# Patient Record
Sex: Female | Born: 1955
Health system: Southern US, Community
[De-identification: ages and names within clinical notes are randomized; demographics above are authoritative.]

## PROBLEM LIST (undated history)

## (undated) DIAGNOSIS — I10 Essential (primary) hypertension: Secondary | ICD-10-CM

## (undated) DIAGNOSIS — T7840XA Allergy, unspecified, initial encounter: Secondary | ICD-10-CM

## (undated) DIAGNOSIS — E119 Type 2 diabetes mellitus without complications: Secondary | ICD-10-CM

## (undated) DIAGNOSIS — I493 Ventricular premature depolarization: Secondary | ICD-10-CM

## (undated) DIAGNOSIS — I491 Atrial premature depolarization: Secondary | ICD-10-CM

## (undated) DIAGNOSIS — I499 Cardiac arrhythmia, unspecified: Secondary | ICD-10-CM

## (undated) HISTORY — DX: Cardiac arrhythmia, unspecified: I49.9

## (undated) HISTORY — DX: Atrial premature depolarization: I49.1

## (undated) HISTORY — PX: BREAST CYST EXCISION: SHX579

## (undated) HISTORY — DX: Ventricular premature depolarization: I49.3

## (undated) HISTORY — DX: Allergy, unspecified, initial encounter: T78.40XA

---

## 1999-03-21 ENCOUNTER — Other Ambulatory Visit: Admission: RE | Admit: 1999-03-21 | Discharge: 1999-03-21 | Payer: Self-pay | Admitting: Obstetrics and Gynecology

## 1999-04-17 ENCOUNTER — Emergency Department (HOSPITAL_COMMUNITY): Admission: EM | Admit: 1999-04-17 | Discharge: 1999-04-17 | Payer: Self-pay | Admitting: Emergency Medicine

## 1999-04-18 ENCOUNTER — Encounter: Payer: Self-pay | Admitting: *Deleted

## 2000-05-04 ENCOUNTER — Encounter: Admission: RE | Admit: 2000-05-04 | Discharge: 2000-05-04 | Payer: Self-pay | Admitting: Cardiology

## 2000-05-04 ENCOUNTER — Encounter: Payer: Self-pay | Admitting: Cardiology

## 2000-10-01 ENCOUNTER — Other Ambulatory Visit: Admission: RE | Admit: 2000-10-01 | Discharge: 2000-10-01 | Payer: Self-pay | Admitting: Obstetrics and Gynecology

## 2002-03-13 ENCOUNTER — Other Ambulatory Visit: Admission: RE | Admit: 2002-03-13 | Discharge: 2002-03-13 | Payer: Self-pay | Admitting: Obstetrics and Gynecology

## 2003-09-30 ENCOUNTER — Other Ambulatory Visit: Admission: RE | Admit: 2003-09-30 | Discharge: 2003-09-30 | Payer: Self-pay | Admitting: Obstetrics and Gynecology

## 2004-12-13 ENCOUNTER — Other Ambulatory Visit: Admission: RE | Admit: 2004-12-13 | Discharge: 2004-12-13 | Payer: Self-pay | Admitting: Obstetrics and Gynecology

## 2005-11-03 ENCOUNTER — Ambulatory Visit: Payer: Self-pay

## 2006-04-18 ENCOUNTER — Other Ambulatory Visit: Admission: RE | Admit: 2006-04-18 | Discharge: 2006-04-18 | Payer: Self-pay | Admitting: Obstetrics and Gynecology

## 2006-11-27 ENCOUNTER — Encounter: Admission: RE | Admit: 2006-11-27 | Discharge: 2006-11-27 | Payer: Self-pay | Admitting: Obstetrics and Gynecology

## 2007-03-27 ENCOUNTER — Ambulatory Visit: Payer: Self-pay | Admitting: Cardiology

## 2007-04-08 ENCOUNTER — Ambulatory Visit: Payer: Self-pay

## 2007-04-17 ENCOUNTER — Ambulatory Visit: Payer: Self-pay | Admitting: Cardiovascular Disease

## 2009-12-28 ENCOUNTER — Ambulatory Visit: Payer: Self-pay | Admitting: Gynecology

## 2009-12-28 ENCOUNTER — Other Ambulatory Visit: Admission: RE | Admit: 2009-12-28 | Discharge: 2009-12-28 | Payer: Self-pay | Admitting: Gynecology

## 2011-01-17 NOTE — Assessment & Plan Note (Signed)
[facility]                            CARDIOLOGY OFFICE NOTE   NAME:[name], Shannon HAUSNER                   MRN:          [id number]  DATE:03/27/2007                            DOB:          October 02, 1955    I got a call this morning from Dr.  Earl Lites to see Shannon [name]-  [name] (when we saw her in 2004, her name was Shannon [name]) for new  onset of atrial fibrillation.   HISTORY OF PRESENT ILLNESS:  We were asked by Dr.  Cleta Alberts to consult on  this very nice 55 -year-old [place] American female for palpitations and  just not feeling well since Sunday. She presented to Urgent Care this  morning and was found to be in atrial fibrillation with a rate of about  80 to 90 beats per minute.   She has been seen in the past and is a patient of Dr.  Theron Arista Nishan's.  She has had a history of PACs and PVCs that have been documented by  Holter monitoring. She had a 2D echo in 2004, which was normal. She also  had a stress Myoview which showed some anterior wall attenuation  probably secondary to breast attenuation. She has been treated  medically.   She is a very active walker and walks almost an hour every day. She has  no symptoms of angina or ischemic equivalence.   She started feeling bad this past Sunday. She has not had any syncope or  pre-syncope. She did have her blood pressure checked at a [facility] and it  was high in the 140s. She usually runs around 120/80.   She has no risk factors for thromboembolic stroke. Hence, a very low  [place] 2 score. Specifically, she has no history of diabetes,  hypertension, previous stroke, history of congestive heart failure and  she is young.   PAST MEDICAL HISTORY:   ALLERGIES:  SHE IS INTOLERANT OF CODEINE.   MEDICATIONS:  Multivitamin and fish oil. She lists something called Vasa  which is for urinary problems.   She does not smoke. She smoked in the past. She does not drinks or use  alcoholic beverages. She  does not use any caffeine.   She has been fasting from 5 a.m. to 5 p.m. for her church. She wonders  if this had anything to do with the atrial fibrillation. I suspect not.   PAST SURGICAL HISTORY:  She has had two C-sections in 1971 and 1977.   FAMILY HISTORY:  Is positive for premature coronary disease with her  mother dying of heart disease at age 11 and her father at age 74. Her  siblings are in good shape.   REVIEW OF SYSTEMS:  Other than the HPI is negative.   PHYSICAL EXAMINATION:  She is an extraordinarily pleasant. She is 5  feet, 3 inches. She weighs 194. Her blood pressure was 130/86. Pulse 80  to 90 and irregular.  HEENT: Normocephalic, atraumatic. PERRLA. Extra-ocular movements intact.  Sclerae are clear. Facial symmetry is normal. Dentition is satisfactory.  NECK: Supple. Carotid upstrokes are equal bilaterally without bruits.  There  is no JVD.  Thyroid is not enlarged. Trachea is midline.  LUNGS:  Are clear.  HEART: Reveals a nondisplaced PMI. She has a variable S1, S2.  ABDOMEN: Soft with good bowel sounds. There is no midline bruit.  EXTREMITIES: Reveals no cyanosis, clubbing or edema. Pulses are intact.  NEURO: Intact.   EKG was reviewed from Urgent Care and also I showed it to Dr.  Jens Som.  It is clearly atrial fibrillation with a well-controlled rate.   I walked her around the hallway and her heart rate jumped to about 120  and she became slightly winded.   ASSESSMENT:  1. New onset idiopathic or lone atrial fibrillation.  2. History of a normal 2D echo and Cardiolite in the past.  3. No symptoms of angina or ischemia.  4. No symptoms of thyroid disease or history of thyroid disease.   PLAN:  1. Begin Diltiazem extended release 240 mg daily.  2. A 2D echocardiogram to rule out any structural heart disease.  3. Enteric-coated aspirin 325 mg daily.  4. Followup with Charlton Haws, MD for further assessment and possibly      getting her back in normal  rhythm if she does not convert.     Thomas C. Daleen Squibb, MD, [facility]  Electronically Signed    TCW/MedQ  DD: 03/27/2007  DT: 03/27/2007  Job #: [id number]   cc:   Brett Canales A. Cleta Alberts, M.D.

## 2011-01-17 NOTE — Assessment & Plan Note (Signed)
[facility]                            CARDIOLOGY OFFICE NOTE   NAME:[name] [name] GARANT                   MRN:          [id number]  DATE:04/17/2007                            DOB:          07-07-1956    [name] [name] is a patient I saw back in 2004.  She was just seen by Dr.  Daleen [name] at the end of July.  She was referred by [name] [name] in Urgent Care  for atrial fibrillation.  The patient has had a history of PACs and  PVCs.  I did review her electrocardiogram and she appeared to have slow  lone atrial fibrillation.  She had a follow-up 2-D echocardiogram on  April 08, 2007, which was totally normal with an EF of 55%.  Dr. Daleen [name]  had told her to start taking Cardizem CD 120 a day.  The patient never  did this.  She has been taking an aspirin.   The patient's [place] score is 0, giving her annual risk of stroke of only  1.9%.   In talking to the patient she has really not noted that many  palpitations.  She is not fatigued.  There has been no TIA or CVA.  She  is nonhypertensive, nondiabetic, with no history of congestive heart  failure and her age is clearly less than 75.   REVIEW OF SYSTEMS:  Remarkable for going through menopause.  She has had  trouble sleeping.  She has taken occasional Ambien.  She otherwise has a  negative review of systems.   She was a little concerned about taking the Cardizem as she works as a  Location manager at [employer].  I explained to her that just about all  the drugs we use in medicine have a very long adverse reaction list, but  that Cardizem would be safe if she needed it in the future.   PHYSICAL EXAMINATION:  Blood pressure 120/70, pulse is 68 and regular.  She is afebrile.  Respiratory rate is 12.  She is a healthy-appearing, black, middle-aged female in no distress.  Affect is appropriate.  HEENT:  Normal.  NECK:  Supple.  There is no thyromegaly, no lymphadenopathy, no JVP  elevation, no goiter.  LUNGS:  Clear.   Good diaphragmatic motion.  No wheezing.  CARDIOVASCULAR:  Normal heart sounds.  PMI is normal.  ABDOMEN:  Benign.  Bowel sounds positive.  No AAA.  No tenderness.  __________  reflux.  EXTREMITIES:  Distal pulses are intact with no edema.  Femorals are +4  bilaterally.  There is no muscular weakness.  SKIN:  Warm and dry.  NEUROLOGIC:  Nonfocal.   EKG today in our office was totally normal.  Heart rate 68, PR interval  140, QRS 80, QT interval 390.   IMPRESSION:  1. Isolated episode of what appears to be lone atrial fibrillation.  I      am not sure if it was vagally mediated but I was impressed that her      heart rate was not very high for a person her age.  For the time  being I told the patient I would not place her on Cardizem.  She is      in no need of Coumadin, given a [place] score of 0.  She will continue      to take a baby aspirin a day.  I will see her back in three months.      She knows to call me if she were to have any rapid palpitations or      TIA-like symptoms.  2. The patient needs health care maintenance.  She usually goes to      Urgent Care.  I will take the liberty of referring her to [name] [name] from [facility], as I think their personalities would      be a good match.  The patient has good health insurance from [facility] through [place] and I do not think this will be an      issue in terms of establishing a long term primary care doctor.  3. In the setting of atrial fibrillation I noticed that the patient      had a CBC at Urgent Care which was fine, but I think she needs a      follow-up TSH and T4.  As long as we can get her in to see Dr.      Waynard [name] in a timely fashion, I will let him do all of the blood work      including her cholesterol check and the rest of her health care      maintenance.     Shannon Pick. Eden Emms, MD, [facility]  Electronically Signed    PCN/MedQ  DD: 04/17/2007  DT: 04/18/2007  Job #: [id number]

## 2011-01-26 ENCOUNTER — Encounter: Payer: Self-pay | Admitting: Cardiovascular Disease

## 2011-01-27 ENCOUNTER — Ambulatory Visit (INDEPENDENT_AMBULATORY_CARE_PROVIDER_SITE_OTHER): Payer: 59 | Admitting: Cardiovascular Disease

## 2011-01-27 ENCOUNTER — Encounter: Payer: Self-pay | Admitting: Cardiovascular Disease

## 2011-01-27 DIAGNOSIS — I480 Paroxysmal atrial fibrillation: Secondary | ICD-10-CM

## 2011-01-27 DIAGNOSIS — I4891 Unspecified atrial fibrillation: Secondary | ICD-10-CM

## 2011-01-27 DIAGNOSIS — R0600 Dyspnea, unspecified: Secondary | ICD-10-CM | POA: Insufficient documentation

## 2011-01-27 NOTE — Assessment & Plan Note (Signed)
Functional NOrmal ECG and exam.  Malaise and fatigue not cardiac related

## 2011-01-27 NOTE — Assessment & Plan Note (Signed)
No evidence of recurrence Continue ASA

## 2011-01-27 NOTE — Progress Notes (Signed)
55 yo referred by Dr Cleta Alberts for fatigue SOB and history of PAF.  Reviewed previous records.  Last seen in 2008  Short episode of isolated lone afib.  Normal echo and myovue.  Only took cardizem for short time. On ASA.  No recurrence.  Has general malaise and mild dyspnea on exertion.  Lab work at urgent care ok.  No overt signs of volume overload or CHF.  Occasional palpitations but nothing prolonged.  Weight up a bit and diet poor.  No syncope, SSCP, PND or orthopnea.  Told patient that her malaise fatigue and mild exertional dyspnea were not cardiac related.  No evidence of recurrent afib and ECG today normal  ROS: Denies fever, malais, weight loss, blurry vision, decreased visual acuity, cough, sputum, SOB, hemoptysis, pleuritic pain, palpitaitons, heartburn, abdominal pain, melena, lower extremity edema, claudication, or rash.   General: Affect appropriate Healthy:  appears stated age HEENT: normal Neck supple with no adenopathy JVP normal no bruits no thyromegaly Lungs clear with no wheezing and good diaphragmatic motion Heart:  S1/S2 no murmur,rub, gallop or click PMI normal Abdomen: benighn, BS positve, no tenderness, no AAA no bruit.  No HSM or HJR Distal pulses intact with no bruits No edema Neuro non-focal Skin warm and dry No muscular weakness  Medications Current Outpatient Prescriptions  Medication Sig Dispense Refill  . aspirin 325 MG tablet Take 325 mg by mouth daily.        . fish oil-omega-3 fatty acids 1000 MG capsule Take 2 g by mouth daily.        . Multiple Vitamin (MULTIVITAMIN) capsule Take 1 capsule by mouth daily.        Marland Kitchen DISCONTD: diltiazem (CARDIZEM CD) 240 MG 24 hr capsule Take 240 mg by mouth daily.          Allergies Codeine  Family History: Family History  Problem Relation Age of Onset  . Heart disease Mother     age 24  . Heart disease Father     age 71    Social History: History   Social History  . Marital Status: Married    Spouse Name:  N/A    Number of Children: N/A  . Years of Education: N/A   Occupational History  . Not on file.   Social History Main Topics  . Smoking status: Former Smoker    Quit date: 09/04/1988  . Smokeless tobacco: Not on file  . Alcohol Use: No  . Drug Use: Not on file  . Sexually Active: Not on file   Other Topics Concern  . Not on file   Social History Narrative  . No narrative on file    Electrocardiogram:  NSR 83 normal ECG  Assessment and Plan

## 2011-07-19 ENCOUNTER — Other Ambulatory Visit: Payer: Self-pay | Admitting: Obstetrics and Gynecology

## 2011-07-19 ENCOUNTER — Other Ambulatory Visit (HOSPITAL_COMMUNITY)
Admission: RE | Admit: 2011-07-19 | Discharge: 2011-07-19 | Disposition: A | Discharge status: Home / Self Care | Payer: 59 | Source: Ambulatory Visit | Attending: Obstetrics and Gynecology | Admitting: Obstetrics and Gynecology

## 2011-07-19 DIAGNOSIS — Z01419 Encounter for gynecological examination (general) (routine) without abnormal findings: Secondary | ICD-10-CM | POA: Insufficient documentation

## 2012-02-13 ENCOUNTER — Encounter: Payer: Self-pay | Admitting: Cardiovascular Disease

## 2012-02-13 ENCOUNTER — Telehealth: Payer: Self-pay | Admitting: Cardiovascular Disease

## 2012-02-13 NOTE — Telephone Encounter (Signed)
New Problem:    I called the home number [phone]) listed for the patient and her emergency contact and received a message that the number had been disconnected or was no longer in service.  I called the work number [phone]) listed for the patient and received a message that had been disconnected.  I sent a letter to patient's listed home address.

## 2013-03-12 ENCOUNTER — Ambulatory Visit (INDEPENDENT_AMBULATORY_CARE_PROVIDER_SITE_OTHER): Payer: 59 | Admitting: Physician Assistant

## 2013-03-12 VITALS — BP 122/84 | HR 82 | Temp 98.3°F | Resp 18 | Ht 64.0 in | Wt 208.0 lb

## 2013-03-12 DIAGNOSIS — J069 Acute upper respiratory infection, unspecified: Secondary | ICD-10-CM

## 2013-03-12 DIAGNOSIS — J029 Acute pharyngitis, unspecified: Secondary | ICD-10-CM

## 2013-03-12 LAB — POCT RAPID STREP A (OFFICE): Rapid Strep A Screen: NEGATIVE

## 2013-03-12 MED ORDER — FIRST-DUKES MOUTHWASH MT SUSP: 10.0000 mL | OROMUCOSAL | Status: DC | PRN

## 2013-03-12 MED ORDER — BENZONATATE 100 MG PO CAPS: 100.0000 mg | ORAL_CAPSULE | Freq: Three times a day (TID) | ORAL | Status: DC | PRN

## 2013-03-12 MED ORDER — IPRATROPIUM BROMIDE 0.03 % NA SOLN: 2.0000 | Freq: Two times a day (BID) | NASAL | Status: DC

## 2013-03-12 NOTE — Patient Instructions (Addendum)
Your rapid strep test is negative today.  I suspect that your symptoms are due to a virus.  I would encourage you to continue taking Zyrtec daily - this will help with nasal symptoms.  Use the Atrovent nasal spray 2-3 times per day to help with congestion and post-nasal drainage.  Magic Mouthwash every 2 hours as needed for sore throat.  I would also recommend 400-600mg  ibuprofen every 8 hours with food to help with throat pain/inflammation.  Tessalon Perles for cough if needed.  Plenty of fluids and rest.  Please let me know if any symptoms are changing or worsening - i.e. Fever, worsening throat pain, worsening cough, shortness of breath.  I am sending out a throat culture as well just to confirm that this is not strep.  I will let you know when the results are back and if we need to do anything differently.   Upper Respiratory Infection, Adult An upper respiratory infection (URI) is also sometimes known as the common cold. The upper respiratory tract includes the nose, sinuses, throat, trachea, and bronchi. Bronchi are the airways leading to the lungs. Most people improve within 1 week, but symptoms can last up to 2 weeks. A residual cough may last even longer.  CAUSES Many different viruses can infect the tissues lining the upper respiratory tract. The tissues become irritated and inflamed and often become very moist. Mucus production is also common. A cold is contagious. You can easily spread the virus to others by oral contact. This includes kissing, sharing a glass, coughing, or sneezing. Touching your mouth or nose and then touching a surface, which is then touched by another person, can also spread the virus. SYMPTOMS  Symptoms typically develop 1 to 3 days after you come in contact with a cold virus. Symptoms vary from person to person. They may include:  Runny nose.  Sneezing.  Nasal congestion.  Sinus irritation.  Sore throat.  Loss of voice  (laryngitis).  Cough.  Fatigue.  Muscle aches.  Loss of appetite.  Headache.  Low-grade fever. DIAGNOSIS  You might diagnose your own cold based on familiar symptoms, since most people get a cold 2 to 3 times a year. Your caregiver can confirm this based on your exam. Most importantly, your caregiver can check that your symptoms are not due to another disease such as strep throat, sinusitis, pneumonia, asthma, or epiglottitis. Blood tests, throat tests, and X-rays are not necessary to diagnose a common cold, but they may sometimes be helpful in excluding other more serious diseases. Your caregiver will decide if any further tests are required. RISKS AND COMPLICATIONS  You may be at risk for a more severe case of the common cold if you smoke cigarettes, have chronic heart disease (such as heart failure) or lung disease (such as asthma), or if you have a weakened immune system. The very young and very old are also at risk for more serious infections. Bacterial sinusitis, middle ear infections, and bacterial pneumonia can complicate the common cold. The common cold can worsen asthma and chronic obstructive pulmonary disease (COPD). Sometimes, these complications can require emergency medical care and may be life-threatening. PREVENTION  The best way to protect against getting a cold is to practice good hygiene. Avoid oral or hand contact with people with cold symptoms. Wash your hands often if contact occurs. There is no clear evidence that vitamin C, vitamin E, echinacea, or exercise reduces the chance of developing a cold. However, it is always recommended to get  plenty of rest and practice good nutrition. TREATMENT  Treatment is directed at relieving symptoms. There is no cure. Antibiotics are not effective, because the infection is caused by a virus, not by bacteria. Treatment may include:  Increased fluid intake. Sports drinks offer valuable electrolytes, sugars, and fluids.  Breathing  heated mist or steam (vaporizer or shower).  Eating chicken soup or other clear broths, and maintaining good nutrition.  Getting plenty of rest.  Using gargles or lozenges for comfort.  Controlling fevers with ibuprofen or acetaminophen as directed by your caregiver.  Increasing usage of your inhaler if you have asthma. Zinc gel and zinc lozenges, taken in the first 24 hours of the common cold, can shorten the duration and lessen the severity of symptoms. Pain medicines may help with fever, muscle aches, and throat pain. A variety of non-prescription medicines are available to treat congestion and runny nose. Your caregiver can make recommendations and may suggest nasal or lung inhalers for other symptoms.  HOME CARE INSTRUCTIONS   Only take over-the-counter or prescription medicines for pain, discomfort, or fever as directed by your caregiver.  Use a warm mist humidifier or inhale steam from a shower to increase air moisture. This may keep secretions moist and make it easier to breathe.  Drink enough water and fluids to keep your urine clear or pale yellow.  Rest as needed.  Return to work when your temperature has returned to normal or as your caregiver advises. You may need to stay home longer to avoid infecting others. You can also use a face mask and careful hand washing to prevent spread of the virus. SEEK MEDICAL CARE IF:   After the first few days, you feel you are getting worse rather than better.  You need your caregiver's advice about medicines to control symptoms.  You develop chills, worsening shortness of breath, or brown or red sputum. These may be signs of pneumonia.  You develop yellow or brown nasal discharge or pain in the face, especially when you bend forward. These may be signs of sinusitis.  You develop a fever, swollen neck glands, pain with swallowing, or white areas in the back of your throat. These may be signs of strep throat. SEEK IMMEDIATE MEDICAL CARE  IF:   You have a fever.  You develop severe or persistent headache, ear pain, sinus pain, or chest pain.  You develop wheezing, a prolonged cough, cough up blood, or have a change in your usual mucus (if you have chronic lung disease).  You develop sore muscles or a stiff neck. Document Released: 02/14/2001 Document Revised: 11/13/2011 Document Reviewed: 12/23/2010 [facility] Patient Information 2014 [place], [place].

## 2013-03-12 NOTE — Progress Notes (Signed)
  Subjective:    Patient ID: Shannon [name], female    DOB: 08-30-1956, 57 y.o.   MRN: [id number]  HPI   [name] is a very pleasant 57 yr old female here with concern for illness.  States that for 3-4 days she has had a sore throat and cough.  Coughing up green stuff.  Some wheezing but no SOB.  The throat was very sore yesterday - "couldn't hardly swallow."  Does endorse some PND but also has nasal drainage as well.  No GI sympoms or HA.  Some itchiness of her ears.  No fever.  Has tried Tylenol and warm liquids.  Was concerned about taking too much Tylenol, though it did help the throat pain.  Pt takes daily Zyrtec for allergies.  No known strep contacts.  Does think a lady at church was sick this past Sunday.     Review of Systems  Constitutional: Negative for chills.  HENT: Positive for congestion, sore throat, rhinorrhea and postnasal drip.   Respiratory: Positive for cough and wheezing. Negative for shortness of breath.   Cardiovascular: Negative.   Gastrointestinal: Negative.   Musculoskeletal: Negative.   Skin: Negative.   Neurological: Negative.        Objective:   Physical Exam  Vitals reviewed. Constitutional: She is oriented to person, place, and time. She appears well-developed and well-nourished. No distress.  HENT:  Head: Normocephalic and atraumatic.  Right Ear: Tympanic membrane and ear canal normal.  Left Ear: Tympanic membrane and ear canal normal.  Nose: Mucosal edema and rhinorrhea present.  Mouth/Throat: Uvula is midline and mucous membranes are normal. Posterior oropharyngeal edema and posterior oropharyngeal erythema present. No oropharyngeal exudate or tonsillar abscesses.  Neck: Neck supple.  Cardiovascular: Normal rate, regular rhythm and normal heart sounds.   Pulmonary/Chest: Effort normal and breath sounds normal. She has no wheezes. She has no rales.  Abdominal: Soft. There is no tenderness.  Lymphadenopathy:    She has no cervical adenopathy.   Neurological: She is alert and oriented to person, place, and time.  Skin: Skin is warm and dry.  Psychiatric: She has a normal mood and affect. Her behavior is normal.     Results for orders placed in visit on 03/12/13  POCT RAPID STREP A (OFFICE)      Result Value Range   Rapid Strep A Screen Negative  Negative        Assessment & Plan:  Viral URI - Plan: ipratropium (ATROVENT) 0.03 % nasal spray, benzonatate (TESSALON) 100 MG capsule  Acute pharyngitis - Plan: POCT rapid strep A, Culture, Group A Strep, Diphenhyd-Hydrocort-Nystatin (FIRST-DUKES MOUTHWASH) SUSP   [name] is a very pleasant 57 yr old female with URI and pharyngitis.  Suspect viral etiology.  She is afebrile and well appearing today.  Rapid strep is negative.  Cx pending.  Will treat symptoms with Atrovent, Duke's, Tessalon, ibuprofen. Continue daily Zyrtec.  Push fluids, plenty of rest.  Discussed RTC precautions with pt, specifically fever, worsening throat pain, worsening cough, SOB, etc.  Pt understands and is in agreement with this plan.

## 2013-03-14 LAB — CULTURE, GROUP A STREP: Organism ID, Bacteria: NORMAL

## 2013-12-18 ENCOUNTER — Ambulatory Visit (INDEPENDENT_AMBULATORY_CARE_PROVIDER_SITE_OTHER): Payer: 59 | Admitting: Emergency Medicine

## 2013-12-18 VITALS — BP 128/80 | HR 107 | Temp 101.0°F | Resp 17 | Ht 64.0 in | Wt 194.0 lb

## 2013-12-18 DIAGNOSIS — J209 Acute bronchitis, unspecified: Secondary | ICD-10-CM

## 2013-12-18 DIAGNOSIS — J018 Other acute sinusitis: Secondary | ICD-10-CM

## 2013-12-18 MED ORDER — IPRATROPIUM BROMIDE 0.02 % IN SOLN
0.5000 mg | Freq: Once | RESPIRATORY_TRACT | Status: AC
  Administered 2013-12-18: 0.5 mg via RESPIRATORY_TRACT

## 2013-12-18 MED ORDER — AMOXICILLIN-POT CLAVULANATE 875-125 MG PO TABS: 1.0000 | ORAL_TABLET | Freq: Two times a day (BID) | ORAL | Status: DC

## 2013-12-18 MED ORDER — ALBUTEROL SULFATE HFA 108 (90 BASE) MCG/ACT IN AERS: 2.0000 | INHALATION_SPRAY | RESPIRATORY_TRACT | Status: DC | PRN

## 2013-12-18 MED ORDER — PSEUDOEPHEDRINE-GUAIFENESIN ER 60-600 MG PO TB12: 1.0000 | ORAL_TABLET | Freq: Two times a day (BID) | ORAL | Status: DC

## 2013-12-18 MED ORDER — ALBUTEROL SULFATE (2.5 MG/3ML) 0.083% IN NEBU
5.0000 mg | INHALATION_SOLUTION | Freq: Once | RESPIRATORY_TRACT | Status: AC
  Administered 2013-12-18: 5 mg via RESPIRATORY_TRACT

## 2013-12-18 NOTE — Progress Notes (Signed)
[facility] [address] [place] [address] [phone]  Date:  12/18/2013   Name:  Shannon [name]   DOB:  07/08/1956   MRN:  [id number]  PCP:  Osborne Casco, MD    Chief Complaint: Cough and URI   History of Present Illness:  Shannon [name] is a 58 y.o. very pleasant female patient who presents with the following:  Ill with nasal congestion and purulent drainage since Tuesday.  Initially had trouble with "allergies" and soon became purulent.  Has a sore throat and cough productive of purulent sputum.  Some wheezing and no shortness of breath.  No nausea or vomiting.  No stool change.  No rash.  No improvement with over the counter medications or other home remedies. Denies other complaint or health concern today.   Patient Active Problem List   Diagnosis Date Noted  . PAF (paroxysmal atrial fibrillation) 01/27/2011  . Dyspnea 01/27/2011    Past Medical History  Diagnosis Date  . Arrhythmia     atrial fibrillation  . PAC (premature atrial contraction)   . PVC (premature ventricular contraction)   . Allergy     Past Surgical History  Procedure Laterality Date  . Cesarean section  1971 &1977    History  Substance Use Topics  . Smoking status: Former Smoker    Quit date: 09/04/1988  . Smokeless tobacco: Not on file  . Alcohol Use: No    Family History  Problem Relation Age of Onset  . Heart disease Mother     age 29  . Hypertension Mother   . Diabetes Mother   . Heart disease Father     age 44  . Hypertension Brother   . Hypertension Maternal Grandmother   . Diabetes Maternal Grandfather     Allergies  Allergen Reactions  . Codeine     Medication list has been reviewed and updated.  Current Outpatient Prescriptions on File Prior to Visit  Medication Sig Dispense Refill  . aspirin 325 MG tablet Take 325 mg by mouth daily.        . benzonatate (TESSALON) 100 MG capsule Take 1-2 capsules (100-200 mg total) by  mouth 3 (three) times daily as needed for cough.  40 capsule  0  . Diphenhyd-Hydrocort-Nystatin (FIRST-DUKES MOUTHWASH) SUSP Take 10 mLs by mouth every 2 (two) hours as needed. With 1:1 ratio viscous lidocaine  360 mL  0  . fish oil-omega-3 fatty acids 1000 MG capsule Take 2 g by mouth daily.        . hydrochlorothiazide (MICROZIDE) 12.5 MG capsule Take 12.5 mg by mouth daily.      Marland Kitchen ipratropium (ATROVENT) 0.03 % nasal spray Place 2 sprays into the nose 2 (two) times daily.  30 mL  1  . Multiple Vitamin (MULTIVITAMIN) capsule Take 1 capsule by mouth daily.         No current facility-administered medications on file prior to visit.    Review of Systems:  As per HPI, otherwise negative.    Physical Examination: Filed Vitals:   12/18/13 1556  BP: 128/80  Pulse: 107  Temp: 101 F (38.3 C)  Resp: 17   Filed Vitals:   12/18/13 1556  Height: 5\' 4"  (1.626 m)  Weight: 194 lb (87.998 kg)   Body mass index is 33.28 kg/(m^2). Ideal Body Weight: Weight in (lb) to have BMI = 25: 145.3  GEN: WDWN, NAD, Non-toxic, A & O x 3 HEENT: Atraumatic, Normocephalic. Neck  supple. No masses, No LAD. Ears and Nose: No external deformity. CV: RRR, No M/G/R. No JVD. No thrill. No extra heart sounds. PULM: CTA B, diffuse coarse wheezes, persistent cough, no crackles, rhonchi. No retractions. No resp. distress. No accessory muscle use. ABD: S, NT, ND, +BS. No rebound. No HSM. EXTR: No c/c/e NEURO Normal gait.  PSYCH: Normally interactive. Conversant. Not depressed or anxious appearing.  Calm demeanor.    Assessment and Plan: Bronchitis Sinusitis Neb treatment Albuterol MDI mucinex d Phen c cod augmentin  Signed,  Ellison Carwin, MD

## 2013-12-18 NOTE — Patient Instructions (Signed)
Sinusitis Sinusitis is redness, soreness, and swelling (inflammation) of the paranasal sinuses. Paranasal sinuses are air pockets within the bones of your face (beneath the eyes, the middle of the forehead, or above the eyes). In healthy paranasal sinuses, mucus is able to drain out, and air is able to circulate through them by way of your nose. However, when your paranasal sinuses are inflamed, mucus and air can become trapped. This can allow bacteria and other germs to grow and cause infection. Sinusitis can develop quickly and last only a short time (acute) or continue over a long period (chronic). Sinusitis that lasts for more than 12 weeks is considered chronic.  CAUSES  Causes of sinusitis include:  Allergies.  Structural abnormalities, such as displacement of the cartilage that separates your nostrils (deviated septum), which can decrease the air flow through your nose and sinuses and affect sinus drainage.  Functional abnormalities, such as when the small hairs (cilia) that line your sinuses and help remove mucus do not work properly or are not present. SYMPTOMS  Symptoms of acute and chronic sinusitis are the same. The primary symptoms are pain and pressure around the affected sinuses. Other symptoms include:  Upper toothache.  Earache.  Headache.  Bad breath.  Decreased sense of smell and taste.  A cough, which worsens when you are lying flat.  Fatigue.  Fever.  Thick drainage from your nose, which often is green and may contain pus (purulent).  Swelling and warmth over the affected sinuses. DIAGNOSIS  Your caregiver will perform a physical exam. During the exam, your caregiver may:  Look in your nose for signs of abnormal growths in your nostrils (nasal polyps).  Tap over the affected sinus to check for signs of infection.  View the inside of your sinuses (endoscopy) with a special imaging device with a light attached (endoscope), which is inserted into your  sinuses. If your caregiver suspects that you have chronic sinusitis, one or more of the following tests may be recommended:  Allergy tests.  Nasal culture A sample of mucus is taken from your nose and sent to a lab and screened for bacteria.  Nasal cytology A sample of mucus is taken from your nose and examined by your caregiver to determine if your sinusitis is related to an allergy. TREATMENT  Most cases of acute sinusitis are related to a viral infection and will resolve on their own within 10 days. Sometimes medicines are prescribed to help relieve symptoms (pain medicine, decongestants, nasal steroid sprays, or saline sprays).  However, for sinusitis related to a bacterial infection, your caregiver will prescribe antibiotic medicines. These are medicines that will help kill the bacteria causing the infection.  Rarely, sinusitis is caused by a fungal infection. In theses cases, your caregiver will prescribe antifungal medicine. For some cases of chronic sinusitis, surgery is needed. Generally, these are cases in which sinusitis recurs more than 3 times per year, despite other treatments. HOME CARE INSTRUCTIONS   Drink plenty of water. Water helps thin the mucus so your sinuses can drain more easily.  Use a humidifier.  Inhale steam 3 to 4 times a day (for example, sit in the bathroom with the shower running).  Apply a warm, moist washcloth to your face 3 to 4 times a day, or as directed by your caregiver.  Use saline nasal sprays to help moisten and clean your sinuses.  Take over-the-counter or prescription medicines for pain, discomfort, or fever only as directed by your caregiver. SEEK IMMEDIATE MEDICAL   CARE IF:  You have increasing pain or severe headaches.  You have nausea, vomiting, or drowsiness.  You have swelling around your face.  You have vision problems.  You have a stiff neck.  You have difficulty breathing. MAKE SURE YOU:   Understand these  instructions.  Will watch your condition.  Will get help right away if you are not doing well or get worse. Document Released: 08/21/2005 Document Revised: 11/13/2011 Document Reviewed: 09/05/2011 [facility] Patient Information 2014 [place], [place]. Metered Dose Inhaler (No Spacer Used) Inhaled medicines are the basis of asthma treatment and other breathing problems. Inhaled medicine can only be effective if used properly. Good technique assures that the medicine reaches the lungs. Metered dose inhalers (MDIs) are used to deliver a variety of inhaled medicines. These include quick relief or rescue medicines (such as bronchodilators) and controller medicines (such as corticosteroids). The medicine is delivered by pushing down on a metal canister to release a set amount of spray. If you are using different kinds of inhalers, use your quick relief medicine to open the airways 10 15 minutes before using a steroid if instructed to do so by your health care provider. If you are unsure which inhalers to use and the order of using them, ask your health care provider, nurse, or respiratory therapist. HOW TO USE THE INHALER 1. Remove cap from inhaler. 2. If you are using the inhaler for the first time, you will need to prime it. Shake the inhaler for 5 seconds and release four puffs into the air, away from your face. Ask your health care provider or pharmacist if you have questions about priming your inhaler. 3. Shake inhaler for 5 seconds before each breath in (inhalation). 4. Position the inhaler so that the top of the canister faces up. 5. Put your index finger on the top of the medicine canister. Your thumb supports the bottom of the inhaler. 6. Open your mouth. 7. Either place the inhaler between your teeth and place your lips tightly around the mouthpiece, or hold the inhaler 1 2 inches away from your open mouth. If you are unsure of which technique to use, ask your health care provider. 8. Breathe out  (exhale) normally and as completely as possible. 9. Press the canister down with the index finger to release the medicine. 10. At the same time as the canister is pressed, inhale deeply and slowly until the lungs are completely filled. This should take 4 6 seconds. Keep your tongue down. 11. Hold the medicine in your lungs for up to 5 10 seconds (10 seconds is best). This helps the medicine get into the small airways of your lungs. 12. Breathe out slowly, through pursed lips. Whistling is an example of pursed lips. 13. Wait at least 1 minute between puffs. Continue with the above steps until you have taken the number of puffs your health care provider has ordered. Do not use the inhaler more than your health care provider directs you to. 14. Replace cap on inhaler. 15. Follow the directions from your health care provider or the inhaler insert for cleaning the inhaler. If you are using a steroid inhaler, rinse your mouth with water after your last puff, gargle, and spit out the water. Do not swallow the water. AVOID:  Inhaling before or after starting the spray of medicine. It takes practice to coordinate your breathing with triggering the spray.  Inhaling through the nose (rather than the mouth) when triggering the spray. HOW TO DETERMINE IF YOUR  INHALER IS FULL OR NEARLY EMPTY You cannot know when an inhaler is empty by shaking it. A few inhalers are now being made with dose counters. Ask your health care provider for a prescription that has a dose counter if you feel you need that extra help. If your inhaler does not have a counter, ask your health care provider to help you determine the date you need to refill your inhaler. Write the refill date on a calendar or your inhaler canister. Refill your inhaler 7 10 days before it runs out. Be sure to keep an adequate supply of medicine. This includes making sure it is not expired, and you have a spare inhaler.  SEEK MEDICAL CARE IF:   Symptoms are  only partially relieved with your inhaler.  You are having trouble using your inhaler.  You experience some increase in phlegm. SEEK IMMEDIATE MEDICAL CARE IF:   You feel little or no relief with your inhalers. You are still wheezing and are feeling shortness of breath or tightness in your chest or both.  You have dizziness, headaches, or fast heart rate.  You have chills, fever, or night sweats.  There is a noticeable increase in phlegm production, or there is blood in the phlegm. Document Released: 06/18/2007 Document Revised: 04/23/2013 Document Reviewed: 02/06/2013 [facility] Patient Information 2014 [place], [place].

## 2014-09-14 ENCOUNTER — Ambulatory Visit (INDEPENDENT_AMBULATORY_CARE_PROVIDER_SITE_OTHER): Payer: 59 | Admitting: Physician Assistant

## 2014-09-14 VITALS — BP 124/76 | HR 74 | Temp 98.4°F | Resp 16 | Ht 64.0 in | Wt 203.0 lb

## 2014-09-14 DIAGNOSIS — R42 Dizziness and giddiness: Secondary | ICD-10-CM

## 2014-09-14 DIAGNOSIS — L719 Rosacea, unspecified: Secondary | ICD-10-CM

## 2014-09-14 MED ORDER — MECLIZINE HCL 25 MG PO TABS: 25.0000 mg | ORAL_TABLET | Freq: Two times a day (BID) | ORAL | Status: DC | PRN

## 2014-09-14 MED ORDER — METRONIDAZOLE 1 % EX GEL: Freq: Every day | CUTANEOUS | Status: DC

## 2014-09-14 NOTE — Progress Notes (Signed)
Subjective:    Patient ID: Shannon [name], female    DOB: 29-Jun-1956, 59 y.o.   MRN: [id number]  HPI Patient presents with dizziness and rash on face that have been present for 6-7 months. Dizziness is felt when bending over, looking up, or laying on right side. Fells off balance when it occurs and sometimes the room is spinning. Dizziness sometimes associated with nausea, but denies vomiting, recent illness, changes in medication, orthopnea, HA, syncope, seizures, weakness, vision changes, palpitations, CP, SOB/DOE, or blood in stool. No h/o CHF, MI, anemia, or GI bleeds/ulcers. PMH of palpations 10 years ago that was cleared by cardiology and medication controlled HTN.   Rash on face itches, but is not painful. Has h/o rosacea that is not being treated. Condition not worse, but sometimes gets pustules that she can pop and it crusts over. Skin is more dry. Tries to stay hydrated despite the cold weather.    Review of Systems  Constitutional: Negative for fever, activity change, appetite change and fatigue.  Eyes: Negative for photophobia and visual disturbance.  Respiratory: Negative for cough, shortness of breath and wheezing.   Cardiovascular: Negative for chest pain, palpitations and leg swelling.  Gastrointestinal: Positive for nausea. Negative for vomiting, abdominal pain, diarrhea and blood in stool.  Genitourinary: Negative for vaginal bleeding.  Allergic/Immunologic: Positive for environmental allergies (seasonal). Negative for food allergies.  Neurological: Positive for dizziness. Negative for seizures, syncope, facial asymmetry, speech difficulty, weakness, light-headedness and headaches.  Hematological: Negative for adenopathy.       Objective:   Physical Exam  Constitutional: She is oriented to person, place, and time. She appears well-developed and well-nourished. No distress.  Blood pressure 124/76, pulse 74, temperature 98.4 F (36.9 C), resp. rate 16, height 5\' 4"   (1.626 m), weight 203 lb (92.08 kg), SpO2 99 %.  HENT:  Head: Normocephalic and atraumatic.  Right Ear: External ear normal.  Left Ear: External ear normal.  Nose: Nose normal.  Mouth/Throat: Oropharynx is clear and moist.  Eyes: Conjunctivae and EOM are normal. Pupils are equal, round, and reactive to light. Right eye exhibits no discharge. Left eye exhibits no discharge. No scleral icterus.  Neck: Normal range of motion. Neck supple. No thyromegaly present.  Cardiovascular: Normal rate, regular rhythm and normal heart sounds.  Exam reveals no gallop and no friction rub.   No murmur heard. Pulmonary/Chest: Effort normal and breath sounds normal. No respiratory distress. She has no wheezes. She has no rales.  Musculoskeletal: Normal range of motion. She exhibits no edema or tenderness.  Lymphadenopathy:    She has no cervical adenopathy.  Neurological: She is alert and oriented to person, place, and time. She has normal reflexes. No cranial nerve deficit. She exhibits normal muscle tone. Coordination normal.  Skin: Skin is warm and dry. No rash noted. She is not diaphoretic. No erythema. No pallor.  Skin of face very dry. Cheeks are rosy without scale. No apparent lesions during visit.         Assessment & Plan:  1. Vertigo If sx have not improved then patient has been advised to call back so that I can refer her to ENT.  - meclizine (ANTIVERT) 25 MG tablet; Take 1 tablet (25 mg total) by mouth 2 (two) times daily as needed.  Dispense: 30 tablet; Refill: 1  2. Rosacea - metroNIDAZOLE (METROGEL) 1 % gel; Apply topically daily.  Dispense: 45 g; Refill: 0 - Ambulatory referral to Dermatology   Crisanto Nied PA-C  [facility] 09/14/2014 7:15 PM

## 2014-09-14 NOTE — Patient Instructions (Signed)

## 2015-04-25 ENCOUNTER — Ambulatory Visit (INDEPENDENT_AMBULATORY_CARE_PROVIDER_SITE_OTHER): Payer: 59 | Admitting: Physician Assistant

## 2015-04-25 VITALS — BP 132/78 | HR 106 | Temp 99.6°F | Resp 18 | Ht 63.0 in | Wt 208.0 lb

## 2015-04-25 DIAGNOSIS — R05 Cough: Secondary | ICD-10-CM | POA: Diagnosis not present

## 2015-04-25 DIAGNOSIS — R059 Cough, unspecified: Secondary | ICD-10-CM

## 2015-04-25 MED ORDER — DOXYCYCLINE HYCLATE 100 MG PO CAPS: 100.0000 mg | ORAL_CAPSULE | Freq: Two times a day (BID) | ORAL | Status: AC

## 2015-04-25 MED ORDER — GUAIFENESIN ER 1200 MG PO TB12: 1.0000 | ORAL_TABLET | Freq: Two times a day (BID) | ORAL | Status: DC | PRN

## 2015-04-25 MED ORDER — ALBUTEROL SULFATE HFA 108 (90 BASE) MCG/ACT IN AERS: 2.0000 | INHALATION_SPRAY | RESPIRATORY_TRACT | Status: DC | PRN

## 2015-04-25 MED ORDER — BENZONATATE 100 MG PO CAPS: 100.0000 mg | ORAL_CAPSULE | Freq: Three times a day (TID) | ORAL | Status: DC | PRN

## 2015-04-25 NOTE — Progress Notes (Signed)
Patient ID: Shannon [name], female    DOB: May 17, 1956, 59 y.o.   MRN: [id number]  PCP: Osborne Casco, MD  Subjective:   Chief Complaint  Patient presents with  . Cough    yellow mucous since friday     HPI Presents for evaluation of cough producing thick yellow mucous.  Her 78 month old great grandson had a cough illness last week. Her symptoms began 2 days ago. She has associated congestion, drainage and sore throat. Feels tired. No fever or chills. No dizziness. No headache. No GI/GU symptoms. Describes a burning sensation in her chest.  No history of asthma. Did have pneumonia once previously. Reports that this happens whenever she gets a cold. She usually waits until she is too sick to work and has to come in repeatedly for breathing treatments and several rounds of antibiotics. Her regular allergies are controlled with OTC oral antihistamine.  Review of Systems  Constitutional: Positive for fatigue. Negative for fever, chills and diaphoresis.  HENT: Positive for congestion, ear pain, postnasal drip, rhinorrhea, sinus pressure, sneezing and sore throat. Negative for dental problem, trouble swallowing and voice change.   Eyes: Negative for visual disturbance.  Respiratory: Positive for cough and wheezing. Negative for chest tightness ("burning").   Cardiovascular: Negative for chest pain, palpitations and leg swelling.  Gastrointestinal: Negative for nausea, vomiting and diarrhea.  Skin: Negative for rash.  Allergic/Immunologic: Positive for environmental allergies.  Neurological: Negative for dizziness, light-headedness and headaches.       Patient Active Problem List   Diagnosis Date Noted  . PAF (paroxysmal atrial fibrillation) 01/27/2011  . Dyspnea 01/27/2011     Prior to Admission medications   Medication Sig Start Date End Date Taking? Authorizing Provider  aspirin 325 MG tablet Take 325 mg by mouth daily.     Yes Historical Provider, MD    benazepril (LOTENSIN) 10 MG tablet Take 10 mg by mouth daily.   Yes Historical Provider, MD  meclizine (ANTIVERT) 25 MG tablet Take 1 tablet (25 mg total) by mouth 2 (two) times daily as needed. 09/14/14  Yes Tishira R Brewington, PA-C  Multiple Vitamin (MULTIVITAMIN) capsule Take 1 capsule by mouth daily.     Yes Historical Provider, MD  fish oil-omega-3 fatty acids 1000 MG capsule Take 2 g by mouth daily.      Historical Provider, MD     Allergies  Allergen Reactions  . Codeine        Objective:  Physical Exam  Constitutional: She is oriented to person, place, and time. Vital signs are normal. She appears well-developed and well-nourished. She is active and cooperative. No distress.  BP 132/78 mmHg  Pulse 106  Temp(Src) 99.6 F (37.6 C) (Oral)  Resp 18  Ht 5\' 3"  (1.6 m)  Wt 208 lb (94.348 kg)  BMI 36.85 kg/m2  SpO2 96%  HENT:  Head: Normocephalic and atraumatic.  Right Ear: Hearing, tympanic membrane, external ear and ear canal normal.  Left Ear: Hearing, tympanic membrane, external ear and ear canal normal.  Nose: Nose normal. Right sinus exhibits no maxillary sinus tenderness and no frontal sinus tenderness. Left sinus exhibits no maxillary sinus tenderness and no frontal sinus tenderness.  Mouth/Throat: Uvula is midline, oropharynx is clear and moist and mucous membranes are normal. Normal dentition.  Eyes: Conjunctivae are normal. No scleral icterus.  Neck: Normal range of motion and phonation normal. Neck supple. No thyromegaly present.  Cardiovascular: Normal rate, regular rhythm and normal heart sounds.   Pulses:  Radial pulses are 2+ on the right side, and 2+ on the left side.  Pulmonary/Chest: Effort normal and breath sounds normal.  Lymphadenopathy:       Head (right side): No tonsillar, no preauricular, no posterior auricular and no occipital adenopathy present.       Head (left side): No tonsillar, no preauricular, no posterior auricular and no occipital  adenopathy present.    She has no cervical adenopathy.       Right: No supraclavicular adenopathy present.       Left: No supraclavicular adenopathy present.  Neurological: She is alert and oriented to person, place, and time. No sensory deficit.  Skin: Skin is warm, dry and intact. No rash noted. No cyanosis or erythema. Nails show no clubbing.  Psychiatric: She has a normal mood and affect. Her speech is normal and behavior is normal.           Assessment & Plan:   1. Cough Cover for bacterial etiology given her history of progressively worsening symptoms requiring antibiotics and nebulized treatments. RTC or see PCP if symptoms worsen or persist. - doxycycline (VIBRAMYCIN) 100 MG capsule; Take 1 capsule (100 mg total) by mouth 2 (two) times daily.  Dispense: 20 capsule; Refill: 0 - Guaifenesin (MUCINEX MAXIMUM STRENGTH) 1200 MG TB12; Take 1 tablet (1,200 mg total) by mouth every 12 (twelve) hours as needed.  Dispense: 14 tablet; Refill: 1 - albuterol (PROVENTIL HFA;VENTOLIN HFA) 108 (90 BASE) MCG/ACT inhaler; Inhale 2 puffs into the lungs every 4 (four) hours as needed for wheezing or shortness of breath (cough, shortness of breath or wheezing.).  Dispense: 1 Inhaler; Refill: 1 - benzonatate (TESSALON PERLES) 100 MG capsule; Take 1-2 capsules (100-200 mg total) by mouth 3 (three) times daily as needed for cough.  Dispense: 40 capsule; Refill: 0   Fara Chute, PA-C Physician Assistant-Certified [facility]

## 2015-04-25 NOTE — Patient Instructions (Signed)
Get plenty of rest and drink at least 64 ounces of water daily. 

## 2016-05-27 ENCOUNTER — Encounter (HOSPITAL_COMMUNITY): Payer: Self-pay | Admitting: Emergency Medicine

## 2016-05-27 ENCOUNTER — Ambulatory Visit (HOSPITAL_COMMUNITY)
Admission: EM | Admit: 2016-05-27 | Discharge: 2016-05-27 | Disposition: A | Discharge status: Home / Self Care | Payer: Commercial Managed Care - HMO | Attending: Internal Medicine | Admitting: Internal Medicine

## 2016-05-27 DIAGNOSIS — J40 Bronchitis, not specified as acute or chronic: Secondary | ICD-10-CM | POA: Diagnosis not present

## 2016-05-27 DIAGNOSIS — J019 Acute sinusitis, unspecified: Secondary | ICD-10-CM

## 2016-05-27 HISTORY — DX: Essential (primary) hypertension: I10

## 2016-05-27 MED ORDER — PREDNISONE 50 MG PO TABS: 50.0000 mg | ORAL_TABLET | Freq: Every day | ORAL | 0 refills | Status: DC

## 2016-05-27 MED ORDER — SODIUM CHLORIDE 0.9 % IN NEBU
INHALATION_SOLUTION | RESPIRATORY_TRACT | Status: AC
  Filled 2016-05-27: qty 3

## 2016-05-27 MED ORDER — AZITHROMYCIN 250 MG PO TABS: 250.0000 mg | ORAL_TABLET | Freq: Every day | ORAL | 0 refills | Status: DC

## 2016-05-27 MED ORDER — IPRATROPIUM-ALBUTEROL 0.5-2.5 (3) MG/3ML IN SOLN
RESPIRATORY_TRACT | Status: AC
  Filled 2016-05-27: qty 3

## 2016-05-27 MED ORDER — ALBUTEROL SULFATE HFA 108 (90 BASE) MCG/ACT IN AERS: 2.0000 | INHALATION_SPRAY | RESPIRATORY_TRACT | 0 refills | Status: DC | PRN

## 2016-05-27 MED ORDER — IPRATROPIUM-ALBUTEROL 0.5-2.5 (3) MG/3ML IN SOLN
3.0000 mL | Freq: Once | RESPIRATORY_TRACT | Status: AC
  Administered 2016-05-27: 3 mL via RESPIRATORY_TRACT

## 2016-05-27 MED ORDER — BENZONATATE 200 MG PO CAPS: 200.0000 mg | ORAL_CAPSULE | Freq: Three times a day (TID) | ORAL | 1 refills | Status: DC | PRN

## 2016-05-27 NOTE — Discharge Instructions (Addendum)
Anticipate gradual improvement in cough, congestion, and well-being over the next several days. Prescriptions for prednisone, benzonatate, and albuterol, were sent to the [facility] on [address].  Prescription for Zithromax was printed, you can start this in a few days if not starting to improve. Recheck or follow-up with primary care provider, Kelton Pillar, for new fever greater then 100.5 or increasing phlegm production.

## 2016-05-27 NOTE — ED Triage Notes (Signed)
Patient reports onset of symptoms Tuesday.  Initially had a sore throat, now just scratchy.  Denies fever.  Patient is coughing up yellow/green phlegm.  Patient's voice is cutting in and out while talking.

## 2016-05-27 NOTE — ED Provider Notes (Signed)
[facility]    CSN: [id number] Arrival date & time: 05/27/16  1203  First Provider Contact:  First MD Initiated Contact with Patient 05/27/16 1306        History   Chief Complaint Chief Complaint  Patient presents with  . URI    HPI Shannon [name] is a 60 y.o. female. She presents today with several days history of wheezy cough, chest tightness, runny/congested nose. Initially had some sore throat. Little bit achy, not severe. Cough is productive, hurts to cough. Malaise. No nausea/vomiting/diarrhea, appetite is a little decreased. Most bothered by wheezy cough, can't stop coughing. Sometimes has to come get a breathing treatment for bronchitis, and has had prednisone in the past.    HPI  Past Medical History:  Diagnosis Date  . Allergy   . Arrhythmia    atrial fibrillation  . Hypertension   . PAC (premature atrial contraction)   . PVC (premature ventricular contraction)     Patient Active Problem List   Diagnosis Date Noted  . PAF (paroxysmal atrial fibrillation) ([place]) 01/27/2011  . Dyspnea 01/27/2011    Past Surgical History:  Procedure Laterality Date  . CESAREAN SECTION  1971 &1977    Home Medications    Prior to Admission medications   Medication Sig Start Date End Date Taking? Authorizing Provider  benazepril (LOTENSIN) 10 MG tablet Take 10 mg by mouth daily.   Yes Historical Provider, MD  albuterol (PROVENTIL HFA;VENTOLIN HFA) 108 (90 BASE) MCG/ACT inhaler Inhale 2 puffs into the lungs every 4 (four) hours as needed for wheezing or shortness of breath (cough, shortness of breath or wheezing.). 04/25/15   Harrison Mons, PA-C  aspirin 325 MG tablet Take 325 mg by mouth daily.      Historical Provider, MD  benzonatate (TESSALON PERLES) 100 MG capsule Take 1-2 capsules (100-200 mg total) by mouth 3 (three) times daily as needed for cough. 04/25/15   Chelle Jeffery, PA-C  cetirizine (ZYRTEC) 10 MG tablet Take 10 mg by mouth daily.    Historical  Provider, MD  fish oil-omega-3 fatty acids 1000 MG capsule Take 2 g by mouth daily.      Historical Provider, MD  Guaifenesin (MUCINEX MAXIMUM STRENGTH) 1200 MG TB12 Take 1 tablet (1,200 mg total) by mouth every 12 (twelve) hours as needed. 04/25/15   Chelle Jeffery, PA-C  meclizine (ANTIVERT) 25 MG tablet Take 1 tablet (25 mg total) by mouth 2 (two) times daily as needed. 09/14/14   Tishira R Brewington, PA-C  Multiple Vitamin (MULTIVITAMIN) capsule Take 1 capsule by mouth daily.      Historical Provider, MD    Family History Family History  Problem Relation Age of Onset  . Heart disease Mother     age 55  . Hypertension Mother   . Diabetes Mother   . Heart disease Father     age 84  . Hypertension Brother   . Hypertension Maternal Grandmother   . Diabetes Maternal Grandfather     Social History Social History  Substance Use Topics  . Smoking status: Former Smoker    Quit date: 09/04/1988  . Smokeless tobacco: Never Used  . Alcohol use No     Allergies   Codeine   Review of Systems Review of Systems  All other systems reviewed and are negative.    Physical Exam Triage Vital Signs ED Triage Vitals  Enc Vitals Group     BP 05/27/16 1233 162/93     Pulse Rate 05/27/16  1233 87     Resp 05/27/16 1233 16     Temp 05/27/16 1233 98.6 F (37 C)     Temp Source 05/27/16 1233 Oral     SpO2 05/27/16 1233 97 %     Weight --      Height --      Pain Score 05/27/16 1259 3   Updated Vital Signs BP 162/93 (BP Location: Left Arm)   Pulse 87   Temp 98.6 F (37 C) (Oral)   Resp 16   SpO2 97%  Physical Exam  Constitutional: She is oriented to person, place, and time. No distress.  Alert, nicely groomed  HENT:  Head: Atraumatic.  Bilateral TMs are moderately dull, no erythema Moderate nasal congestion bilaterally, voice sounds congested Throat is a little red  Eyes:  Conjugate gaze, no eye redness/drainage  Neck: Neck supple.  Cardiovascular: Normal rate and regular  rhythm.   Pulmonary/Chest: No respiratory distress. She has no wheezes. She has no rales.  Coarse but symmetric breath sounds throughout, somewhat diminished posteriorly. Slightly increased respiratory effort, and unable to stop coughing during the exam  Abdominal: She exhibits no distension.  Musculoskeletal: Normal range of motion.  No leg swelling  Neurological: She is alert and oriented to person, place, and time.  Skin: Skin is warm and dry.  No cyanosis  Nursing note and vitals reviewed.    UC Treatments / Results   Procedures Procedures (including critical care time)      None today  Medications Ordered in UC Medications  ipratropium-albuterol (DUONEB) 0.5-2.5 (3) MG/3ML nebulizer solution 3 mL (3 mLs Nebulization Given 05/27/16 1324)    Final Clinical Impressions(s) / UC Diagnoses   Final diagnoses:  Bronchitis  Acute sinusitis, unspecified   Anticipate gradual improvement in cough, congestion, and well-being over the next several days. Prescriptions for prednisone, benzonatate, and albuterol, were sent to the [facility] on [address].  Prescription for Zithromax was printed, you can start this in a few days if not starting to improve. Recheck or follow-up with primary care provider, Kelton Pillar, for new fever greater then 100.5 or increasing phlegm production.  New Prescriptions New Prescriptions   ALBUTEROL (PROVENTIL HFA;VENTOLIN HFA) 108 (90 BASE) MCG/ACT INHALER    Inhale 2 puffs into the lungs every 4 (four) hours as needed for wheezing or shortness of breath.   AZITHROMYCIN (ZITHROMAX) 250 MG TABLET    Take 1 tablet (250 mg total) by mouth daily. Take first 2 tablets together, then 1 every day until finished.   BENZONATATE (TESSALON) 200 MG CAPSULE    Take 1 capsule (200 mg total) by mouth 3 (three) times daily as needed for cough.   PREDNISONE (DELTASONE) 50 MG TABLET    Take 1 tablet (50 mg total) by mouth daily.     Sherlene Shams, MD 05/29/16 [phone]

## 2016-07-03 ENCOUNTER — Ambulatory Visit
Admission: RE | Admit: 2016-07-03 | Discharge: 2016-07-03 | Disposition: A | Discharge status: Home / Self Care | Payer: Commercial Managed Care - HMO | Source: Ambulatory Visit | Attending: Family Medicine | Admitting: Family Medicine

## 2016-07-03 ENCOUNTER — Other Ambulatory Visit: Payer: Self-pay | Admitting: Family Medicine

## 2016-07-03 DIAGNOSIS — J45909 Unspecified asthma, uncomplicated: Secondary | ICD-10-CM

## 2016-07-03 DIAGNOSIS — R05 Cough: Secondary | ICD-10-CM

## 2016-07-03 DIAGNOSIS — R059 Cough, unspecified: Secondary | ICD-10-CM

## 2016-08-07 ENCOUNTER — Other Ambulatory Visit: Payer: Self-pay | Admitting: Obstetrics and Gynecology

## 2016-08-07 ENCOUNTER — Other Ambulatory Visit: Payer: Self-pay | Admitting: Family Medicine

## 2016-08-07 ENCOUNTER — Ambulatory Visit
Admission: RE | Admit: 2016-08-07 | Discharge: 2016-08-07 | Disposition: A | Discharge status: Home / Self Care | Payer: Commercial Managed Care - HMO | Source: Ambulatory Visit | Attending: Family Medicine | Admitting: Family Medicine

## 2016-08-07 ENCOUNTER — Other Ambulatory Visit (HOSPITAL_COMMUNITY)
Admission: RE | Admit: 2016-08-07 | Discharge: 2016-08-07 | Disposition: A | Discharge status: Home / Self Care | Payer: Commercial Managed Care - HMO | Source: Ambulatory Visit | Attending: Obstetrics and Gynecology | Admitting: Obstetrics and Gynecology

## 2016-08-07 DIAGNOSIS — J189 Pneumonia, unspecified organism: Secondary | ICD-10-CM

## 2016-08-07 DIAGNOSIS — Z1151 Encounter for screening for human papillomavirus (HPV): Secondary | ICD-10-CM | POA: Diagnosis not present

## 2016-08-07 DIAGNOSIS — Z01419 Encounter for gynecological examination (general) (routine) without abnormal findings: Secondary | ICD-10-CM | POA: Diagnosis present

## 2016-08-08 LAB — CYTOLOGY - PAP
Diagnosis: NEGATIVE
HPV (WINDOPATH): NOT DETECTED

## 2016-09-09 DIAGNOSIS — J0100 Acute maxillary sinusitis, unspecified: Secondary | ICD-10-CM | POA: Diagnosis not present

## 2016-09-09 DIAGNOSIS — R05 Cough: Secondary | ICD-10-CM | POA: Diagnosis not present

## 2016-09-18 DIAGNOSIS — N952 Postmenopausal atrophic vaginitis: Secondary | ICD-10-CM | POA: Diagnosis not present

## 2016-10-06 DIAGNOSIS — R05 Cough: Secondary | ICD-10-CM | POA: Diagnosis not present

## 2016-10-06 DIAGNOSIS — J069 Acute upper respiratory infection, unspecified: Secondary | ICD-10-CM | POA: Diagnosis not present

## 2016-11-28 DIAGNOSIS — E785 Hyperlipidemia, unspecified: Secondary | ICD-10-CM | POA: Diagnosis not present

## 2016-11-28 DIAGNOSIS — E119 Type 2 diabetes mellitus without complications: Secondary | ICD-10-CM | POA: Diagnosis not present

## 2016-11-28 DIAGNOSIS — I10 Essential (primary) hypertension: Secondary | ICD-10-CM | POA: Diagnosis not present

## 2016-12-06 DIAGNOSIS — Z889 Allergy status to unspecified drugs, medicaments and biological substances status: Secondary | ICD-10-CM | POA: Diagnosis not present

## 2016-12-06 DIAGNOSIS — L219 Seborrheic dermatitis, unspecified: Secondary | ICD-10-CM | POA: Diagnosis not present

## 2016-12-06 DIAGNOSIS — T783XXA Angioneurotic edema, initial encounter: Secondary | ICD-10-CM | POA: Diagnosis not present

## 2017-01-09 DIAGNOSIS — H40033 Anatomical narrow angle, bilateral: Secondary | ICD-10-CM | POA: Diagnosis not present

## 2017-01-09 DIAGNOSIS — H04123 Dry eye syndrome of bilateral lacrimal glands: Secondary | ICD-10-CM | POA: Diagnosis not present

## 2017-06-05 DIAGNOSIS — Z0000 Encounter for general adult medical examination without abnormal findings: Secondary | ICD-10-CM | POA: Diagnosis not present

## 2017-06-05 DIAGNOSIS — Z23 Encounter for immunization: Secondary | ICD-10-CM | POA: Diagnosis not present

## 2017-06-05 DIAGNOSIS — I10 Essential (primary) hypertension: Secondary | ICD-10-CM | POA: Diagnosis not present

## 2017-06-05 DIAGNOSIS — E119 Type 2 diabetes mellitus without complications: Secondary | ICD-10-CM | POA: Diagnosis not present

## 2017-06-05 DIAGNOSIS — E785 Hyperlipidemia, unspecified: Secondary | ICD-10-CM | POA: Diagnosis not present

## 2017-07-31 DIAGNOSIS — Z13820 Encounter for screening for osteoporosis: Secondary | ICD-10-CM | POA: Diagnosis not present

## 2017-08-09 ENCOUNTER — Other Ambulatory Visit (HOSPITAL_COMMUNITY)
Admission: RE | Admit: 2017-08-09 | Discharge: 2017-08-09 | Disposition: A | Discharge status: Home / Self Care | Payer: 59 | Source: Ambulatory Visit | Attending: Obstetrics and Gynecology | Admitting: Obstetrics and Gynecology

## 2017-08-09 ENCOUNTER — Other Ambulatory Visit: Payer: Self-pay | Admitting: Obstetrics and Gynecology

## 2017-08-09 DIAGNOSIS — Z01419 Encounter for gynecological examination (general) (routine) without abnormal findings: Secondary | ICD-10-CM | POA: Diagnosis not present

## 2017-08-10 LAB — CYTOLOGY - PAP: Diagnosis: NEGATIVE

## 2017-12-04 DIAGNOSIS — E785 Hyperlipidemia, unspecified: Secondary | ICD-10-CM | POA: Diagnosis not present

## 2017-12-04 DIAGNOSIS — I10 Essential (primary) hypertension: Secondary | ICD-10-CM | POA: Diagnosis not present

## 2017-12-04 DIAGNOSIS — E119 Type 2 diabetes mellitus without complications: Secondary | ICD-10-CM | POA: Diagnosis not present

## 2018-02-12 DIAGNOSIS — Z1211 Encounter for screening for malignant neoplasm of colon: Secondary | ICD-10-CM | POA: Diagnosis not present

## 2018-04-01 DIAGNOSIS — D122 Benign neoplasm of ascending colon: Secondary | ICD-10-CM | POA: Diagnosis not present

## 2018-04-01 DIAGNOSIS — K635 Polyp of colon: Secondary | ICD-10-CM | POA: Diagnosis not present

## 2018-04-01 DIAGNOSIS — Z1211 Encounter for screening for malignant neoplasm of colon: Secondary | ICD-10-CM | POA: Diagnosis not present

## 2018-04-01 DIAGNOSIS — D125 Benign neoplasm of sigmoid colon: Secondary | ICD-10-CM | POA: Diagnosis not present

## 2018-08-06 DIAGNOSIS — E1122 Type 2 diabetes mellitus with diabetic chronic kidney disease: Secondary | ICD-10-CM | POA: Diagnosis not present

## 2018-08-06 DIAGNOSIS — Z0000 Encounter for general adult medical examination without abnormal findings: Secondary | ICD-10-CM | POA: Diagnosis not present

## 2018-08-06 DIAGNOSIS — Z23 Encounter for immunization: Secondary | ICD-10-CM | POA: Diagnosis not present

## 2018-10-15 DIAGNOSIS — Z01419 Encounter for gynecological examination (general) (routine) without abnormal findings: Secondary | ICD-10-CM | POA: Diagnosis not present

## 2018-11-02 DIAGNOSIS — E119 Type 2 diabetes mellitus without complications: Secondary | ICD-10-CM | POA: Diagnosis not present

## 2018-11-02 DIAGNOSIS — H40033 Anatomical narrow angle, bilateral: Secondary | ICD-10-CM | POA: Diagnosis not present

## 2019-08-11 ENCOUNTER — Ambulatory Visit
Admission: RE | Admit: 2019-08-11 | Discharge: 2019-08-11 | Disposition: A | Discharge status: Home / Self Care | Payer: 59 | Source: Ambulatory Visit | Attending: Family Medicine | Admitting: Family Medicine

## 2019-08-11 ENCOUNTER — Other Ambulatory Visit: Payer: Self-pay | Admitting: Family Medicine

## 2019-08-11 ENCOUNTER — Other Ambulatory Visit: Payer: Self-pay

## 2019-08-11 DIAGNOSIS — Z1231 Encounter for screening mammogram for malignant neoplasm of breast: Secondary | ICD-10-CM

## 2019-09-04 ENCOUNTER — Ambulatory Visit
Admission: EM | Admit: 2019-09-04 | Discharge: 2019-09-04 | Disposition: A | Discharge status: Home / Self Care | Payer: 59 | Attending: Physician Assistant | Admitting: Physician Assistant

## 2019-09-04 ENCOUNTER — Other Ambulatory Visit: Payer: Self-pay

## 2019-09-04 ENCOUNTER — Ambulatory Visit (INDEPENDENT_AMBULATORY_CARE_PROVIDER_SITE_OTHER): Payer: 59

## 2019-09-04 DIAGNOSIS — I10 Essential (primary) hypertension: Secondary | ICD-10-CM | POA: Diagnosis not present

## 2019-09-04 DIAGNOSIS — R509 Fever, unspecified: Secondary | ICD-10-CM

## 2019-09-04 DIAGNOSIS — Z20828 Contact with and (suspected) exposure to other viral communicable diseases: Secondary | ICD-10-CM | POA: Diagnosis not present

## 2019-09-04 DIAGNOSIS — R0902 Hypoxemia: Secondary | ICD-10-CM

## 2019-09-04 DIAGNOSIS — R05 Cough: Secondary | ICD-10-CM

## 2019-09-04 DIAGNOSIS — Z20822 Contact with and (suspected) exposure to covid-19: Secondary | ICD-10-CM

## 2019-09-04 DIAGNOSIS — R059 Cough, unspecified: Secondary | ICD-10-CM

## 2019-09-04 MED ORDER — ALBUTEROL SULFATE HFA 108 (90 BASE) MCG/ACT IN AERS: 2.0000 | INHALATION_SPRAY | RESPIRATORY_TRACT | 0 refills | Status: DC | PRN

## 2019-09-04 MED ORDER — ACETAMINOPHEN 325 MG PO TABS
975.0000 mg | ORAL_TABLET | Freq: Once | ORAL | Status: AC
  Administered 2019-09-04: 975 mg via ORAL

## 2019-09-04 MED ORDER — DOXYCYCLINE HYCLATE 100 MG PO CAPS: 100.0000 mg | ORAL_CAPSULE | Freq: Two times a day (BID) | ORAL | 0 refills | Status: AC

## 2019-09-04 NOTE — ED Triage Notes (Signed)
Pt c/o cough, fatigue, body aches, fever, loss of taste and smell x1wk

## 2019-09-04 NOTE — ED Provider Notes (Signed)
[name]    CSN: [id number] Arrival date & time: 09/04/19  1639      History   Chief Complaint Chief Complaint  Patient presents with  . Fever    HPI Shannon [name] is a 63 y.o. female.   63 year old female comes in for 7 day of COVID like symptoms.cough, fatigue, body aches, fever, loss of taste/smell. Denies rhinorrhea, nasal congestion. Tmax 101.5, has been taking aspirin 162mg . Denies abdominal pain, nausea, vomiting, diarrhea. Denies shortness of breath, chest pain, palpitations. Bilateral rib soreness from cough. Former smoker.   History of paroxysmal afib, states this is "gone" and has not needed to see cardiologist. Not on blood thinner.      Past Medical History:  Diagnosis Date  . Allergy   . Arrhythmia    atrial fibrillation  . Hypertension   . PAC (premature atrial contraction)   . PVC (premature ventricular contraction)     Patient Active Problem List   Diagnosis Date Noted  . PAF (paroxysmal atrial fibrillation) ([place]) 01/27/2011  . Dyspnea 01/27/2011    Past Surgical History:  Procedure Laterality Date  . BREAST CYST EXCISION Bilateral   . CESAREAN SECTION  1971 &1977    OB History   No obstetric history on file.      Home Medications    Prior to Admission medications   Medication Sig Start Date End Date Taking? Authorizing Provider  albuterol (VENTOLIN HFA) 108 (90 Base) MCG/ACT inhaler Inhale 2 puffs into the lungs every 4 (four) hours as needed for wheezing or shortness of breath. 09/04/19   Ok Edwards, PA-C  aspirin 325 MG tablet Take 325 mg by mouth daily.      [provider]  benazepril (LOTENSIN) 10 MG tablet Take 10 mg by mouth daily.    [provider]  cetirizine (ZYRTEC) 10 MG tablet Take 10 mg by mouth daily.    [provider]  doxycycline (VIBRAMYCIN) 100 MG capsule Take 1 capsule (100 mg total) by mouth 2 (two) times daily. 09/04/19   Tasia Catchings, Mccall Lomax V, PA-C  fish oil-omega-3 fatty  acids 1000 MG capsule Take 2 g by mouth daily.      [provider]    Family History Family History  Problem Relation Age of Onset  . Heart disease Mother        age 2  . Hypertension Mother   . Diabetes Mother   . Heart disease Father        age 37  . Hypertension Brother   . Hypertension Maternal Grandmother   . Diabetes Maternal Grandfather     Social History Social History   Tobacco Use  . Smoking status: Former Smoker    Quit date: 09/04/1988    Years since quitting: 31.0  . Smokeless tobacco: Never Used  Substance Use Topics  . Alcohol use: No  . Drug use: No     Allergies   Codeine   Review of Systems Review of Systems  Reason unable to perform ROS: See HPI as above.     Physical Exam Triage Vital Signs ED Triage Vitals [09/04/19 1746]  Enc Vitals Group     BP (!) 158/88     Pulse Rate (!) 101     Resp 18     Temp (!) 101.5 F (38.6 C)     Temp Source Oral     SpO2      Weight      Height  Head Circumference      Peak Flow      Pain Score 5     Pain Loc      Pain Edu?      Excl. in [place]?    No data found.  Updated Vital Signs BP (!) 158/88 (BP Location: Left Arm)   Pulse (!) 101   Temp (!) 101.5 F (38.6 C) (Oral)   Resp 18   SpO2 91%   Physical Exam Constitutional:      General: She is not in acute distress.    Appearance: Normal appearance. She is not ill-appearing, toxic-appearing or diaphoretic.  HENT:     Head: Normocephalic and atraumatic.     Mouth/Throat:     Mouth: Mucous membranes are moist.     Pharynx: Oropharynx is clear. Uvula midline.  Cardiovascular:     Rate and Rhythm: Normal rate and regular rhythm.     Heart sounds: Normal heart sounds. No murmur. No friction rub. No gallop.   Pulmonary:     Effort: Pulmonary effort is normal. No accessory muscle usage, prolonged expiration, respiratory distress or retractions.     Comments: Speaking in full sentences without difficulty. O2 sat ranging 89-92%.  After coughing, O2 will increase to 95% at times. Lungs clear to auscultation without adventitious lung sounds. Musculoskeletal:     Cervical back: Normal range of motion and neck supple.  Neurological:     General: No focal deficit present.     Mental Status: She is alert and oriented to person, place, and time.      UC Treatments / Results  Labs (all labs ordered are listed, but only abnormal results are displayed) Labs Reviewed  NOVEL CORONAVIRUS, NAA    EKG   Radiology DG Chest 2 View  Result Date: 09/04/2019 CLINICAL DATA:  Fever.  Poor oxygen saturation. EXAM: CHEST - 2 VIEW COMPARISON:  08/07/2016 FINDINGS: Heart size is normal. Chronic aortic atherosclerosis. Mild bronchial thickening. No evidence of consolidation, collapse or effusion. One could question hazy alveolar infiltrate in the lower right lung. No significant bone finding. IMPRESSION: Bronchitis pattern. Question mild hazy infiltrate in the right lower lung. No dense consolidation or collapse. Electronically Signed   By: Nelson Chimes M.D.   On: 09/04/2019 18:55    Procedures Procedures (including critical care time)  Medications Ordered in UC Medications  acetaminophen (TYLENOL) tablet 975 mg (975 mg Oral Given 09/04/19 1800)    Initial Impression / Assessment and Plan / UC Course  I have reviewed the triage vital signs and the nursing notes.  Pertinent labs & imaging results that were available during my care of the patient were reviewed by me and considered in my medical decision making (see chart for details).    Chest x-ray with questionable mild hazy infiltrate to the right lower lung.  Given patient with fever, decreased O2 sat, will cover for bacterial pneumonia with doxycycline.  However, discussed with patient, given history and exam, highly suspicious for Covid.  Given 1 week history, does not qualify for rapid testing.  Covid PCR testing ordered, patient to quarantine until testing results.   However, given highly suspicious for Covid, will likely need to quarantine until symptoms improve regardless of testing results.   Currently patient O2 saturation ranging 89% to 92%.  After cough, can increase to 95-96% at room air. ?  Mucous plug causing decreased O2 sat.  However, patient without shortness of breath.  No increased work of breath.  Although concerning,  does not necessarily need ED evaluation at this time.  Discussed strict monitoring with low threshold for ED evaluation.  Patient expresses understanding, and would like to monitor at home at this time.   Final Clinical Impressions(s) / UC Diagnoses   Final diagnoses:  Cough  Suspected COVID-19 virus infection   ED Prescriptions    Medication Sig Dispense Auth. Provider   doxycycline (VIBRAMYCIN) 100 MG capsule Take 1 capsule (100 mg total) by mouth 2 (two) times daily. 14 capsule Quaron Delacruz V, PA-C   albuterol (VENTOLIN HFA) 108 (90 Base) MCG/ACT inhaler Inhale 2 puffs into the lungs every 4 (four) hours as needed for wheezing or shortness of breath. 8 g Ok Edwards, PA-C     PDMP not reviewed this encounter.   Ok Edwards, PA-C 09/04/19 2041

## 2019-09-04 NOTE — Discharge Instructions (Signed)
Chest xray shows possible pneumonia to the right lower lobe. Start doxycycline as directed. Albuterol as needed for cough/shortness of breath. Tylenol/motrin for pain/fever. If developing any worsening symptoms, shortness of breath, chest pain, weakness, dizziness, go to the emergency department for further evaluation needed.

## 2019-09-05 ENCOUNTER — Telehealth: Payer: Self-pay | Admitting: Emergency Medicine

## 2019-09-05 NOTE — Telephone Encounter (Signed)
Attempted to call patient to check in on symptoms, no answer, left voicemail.

## 2019-09-07 LAB — NOVEL CORONAVIRUS, NAA: SARS-CoV-2, NAA: DETECTED — AB

## 2019-09-08 ENCOUNTER — Telehealth (HOSPITAL_COMMUNITY): Payer: Self-pay | Admitting: Emergency Medicine

## 2019-09-08 NOTE — Telephone Encounter (Signed)

## 2019-09-12 ENCOUNTER — Encounter (HOSPITAL_COMMUNITY): Payer: Self-pay | Admitting: *Deleted

## 2019-09-12 ENCOUNTER — Emergency Department (HOSPITAL_COMMUNITY)
Admission: EM | Admit: 2019-09-12 | Discharge: 2019-09-12 | Disposition: A | Discharge status: Home / Self Care | Payer: 59 | Attending: Emergency Medicine | Admitting: Emergency Medicine

## 2019-09-12 ENCOUNTER — Emergency Department (HOSPITAL_COMMUNITY): Payer: 59

## 2019-09-12 DIAGNOSIS — U071 COVID-19: Secondary | ICD-10-CM | POA: Insufficient documentation

## 2019-09-12 DIAGNOSIS — I10 Essential (primary) hypertension: Secondary | ICD-10-CM | POA: Insufficient documentation

## 2019-09-12 DIAGNOSIS — I480 Paroxysmal atrial fibrillation: Secondary | ICD-10-CM | POA: Diagnosis not present

## 2019-09-12 DIAGNOSIS — Z87891 Personal history of nicotine dependence: Secondary | ICD-10-CM | POA: Diagnosis not present

## 2019-09-12 DIAGNOSIS — R0602 Shortness of breath: Secondary | ICD-10-CM | POA: Diagnosis present

## 2019-09-12 LAB — CBC WITH DIFFERENTIAL/PLATELET
Abs Immature Granulocytes: 0 10*3/uL (ref 0.00–0.07)
Basophils Absolute: 0 10*3/uL (ref 0.0–0.1)
Basophils Relative: 0 %
Eosinophils Absolute: 0.1 10*3/uL (ref 0.0–0.5)
Eosinophils Relative: 1 %
HCT: 41.2 % (ref 36.0–46.0)
Hemoglobin: 13 g/dL (ref 12.0–15.0)
Lymphocytes Relative: 30 %
Lymphs Abs: 1.9 10*3/uL (ref 0.7–4.0)
MCH: 24.5 pg — ABNORMAL LOW (ref 26.0–34.0)
MCHC: 31.6 g/dL (ref 30.0–36.0)
MCV: 77.6 fL — ABNORMAL LOW (ref 80.0–100.0)
Monocytes Absolute: 0.2 10*3/uL (ref 0.1–1.0)
Monocytes Relative: 3 %
Neutro Abs: 4.2 10*3/uL (ref 1.7–7.7)
Neutrophils Relative %: 66 %
Platelets: 504 10*3/uL — ABNORMAL HIGH (ref 150–400)
RBC: 5.31 MIL/uL — ABNORMAL HIGH (ref 3.87–5.11)
RDW: 13.7 % (ref 11.5–15.5)
WBC: 6.3 10*3/uL (ref 4.0–10.5)
nRBC: 0 % (ref 0.0–0.2)
nRBC: 0 /100 WBC

## 2019-09-12 LAB — COMPREHENSIVE METABOLIC PANEL
ALT: 32 U/L (ref 0–44)
AST: 18 U/L (ref 15–41)
Albumin: 3.4 g/dL — ABNORMAL LOW (ref 3.5–5.0)
Alkaline Phosphatase: 60 U/L (ref 38–126)
Anion gap: 12 (ref 5–15)
BUN: 11 mg/dL (ref 8–23)
CO2: 24 mmol/L (ref 22–32)
Calcium: 8.7 mg/dL — ABNORMAL LOW (ref 8.9–10.3)
Chloride: 103 mmol/L (ref 98–111)
Creatinine, Ser: 1.04 mg/dL — ABNORMAL HIGH (ref 0.44–1.00)
GFR calc Af Amer: >60 mL/min (ref >60)
GFR calc non Af Amer: 57 mL/min — ABNORMAL LOW (ref >60)
Glucose, Bld: 111 mg/dL — ABNORMAL HIGH (ref 70–99)
Potassium: 4.1 mmol/L (ref 3.5–5.1)
Sodium: 139 mmol/L (ref 135–145)
Total Bilirubin: 0.9 mg/dL (ref 0.3–1.2)
Total Protein: 7.3 g/dL (ref 6.5–8.1)

## 2019-09-12 MED ORDER — ONDANSETRON HCL 4 MG/2ML IJ SOLN
4.0000 mg | Freq: Once | INTRAMUSCULAR | Status: DC
  Filled 2019-09-12: qty 2

## 2019-09-12 MED ORDER — SODIUM CHLORIDE 0.9 % IV BOLUS
1000.0000 mL | Freq: Once | INTRAVENOUS | Status: AC
  Administered 2019-09-12: 13:00:00 1000 mL via INTRAVENOUS

## 2019-09-12 NOTE — ED Provider Notes (Signed)
[place] EMERGENCY DEPARTMENT Provider Note   CSN: [id number] Arrival date & time: 09/12/19  1103     History Chief Complaint  Patient presents with  . covid +  . Shortness of Breath    Shannon [name] is a 64 y.o. female with history of hypertension and prediabetes who presents with weakness and shortness of breath.  Patient states that she was diagnosed with Covid after being tested on 1231.  Her husband got sick after her also here for an evaluation today.  She states she has not had a fever in 2 days.  She is to start trying to stay hydrated but is not eating very much and feels generally weak and fatigued.  She also feels short of breath and this is worse with exertion.  She has been checking her O2 sats at home because she has a pulse ox and sats will dip into the high 80s but then go back up into the 90s.  She was given doxycycline at urgent care but is not taking it.  She reports associated loss of taste, loss of appetite, constipation which she has been taking laxatives for, dark urine.  HPI     Past Medical History:  Diagnosis Date  . Allergy   . Arrhythmia    atrial fibrillation  . Hypertension   . PAC (premature atrial contraction)   . PVC (premature ventricular contraction)     Patient Active Problem List   Diagnosis Date Noted  . PAF (paroxysmal atrial fibrillation) ([place]) 01/27/2011  . Dyspnea 01/27/2011    Past Surgical History:  Procedure Laterality Date  . BREAST CYST EXCISION Bilateral   . CESAREAN SECTION  1971 &1977     OB History   No obstetric history on file.     Family History  Problem Relation Age of Onset  . Heart disease Mother        age 31  . Hypertension Mother   . Diabetes Mother   . Heart disease Father        age 100  . Hypertension Brother   . Hypertension Maternal Grandmother   . Diabetes Maternal Grandfather     Social History   Tobacco Use  . Smoking status: Former Smoker    Quit date: 09/04/1988     Years since quitting: 31.0  . Smokeless tobacco: Never Used  Substance Use Topics  . Alcohol use: No  . Drug use: No    Home Medications Prior to Admission medications   Medication Sig Start Date End Date Taking? Authorizing Provider  albuterol (VENTOLIN HFA) 108 (90 Base) MCG/ACT inhaler Inhale 2 puffs into the lungs every 4 (four) hours as needed for wheezing or shortness of breath. 09/04/19   Ok Edwards, PA-C  aspirin 325 MG tablet Take 325 mg by mouth daily.      [provider]  benazepril (LOTENSIN) 10 MG tablet Take 10 mg by mouth daily.    [provider]  cetirizine (ZYRTEC) 10 MG tablet Take 10 mg by mouth daily.    [provider]  doxycycline (VIBRAMYCIN) 100 MG capsule Take 1 capsule (100 mg total) by mouth 2 (two) times daily. 09/04/19   Tasia Catchings, Amy V, PA-C  fish oil-omega-3 fatty acids 1000 MG capsule Take 2 g by mouth daily.      [provider]    Allergies    Codeine  Review of Systems   Review of Systems  Constitutional: Positive for activity change,  appetite change, fatigue and fever (resolved).  Respiratory: Positive for cough and shortness of breath.   Cardiovascular: Negative for chest pain.  Gastrointestinal: Positive for constipation and nausea. Negative for abdominal pain, diarrhea and vomiting.  Genitourinary: Negative for difficulty urinating and dysuria.  Neurological: Positive for weakness.  All other systems reviewed and are negative.   Physical Exam Updated Vital Signs BP (!) 151/99 (BP Location: Right Arm)   Pulse (!) 107   Temp 98.7 F (37.1 C) (Oral)   Resp 18   SpO2 98%   Physical Exam Vitals and nursing note reviewed.  Constitutional:      General: She is not in acute distress.    Appearance: She is well-developed. She is not ill-appearing.     Comments: Calm, cooperative.  Overall well-appearing  HENT:     Head: Normocephalic and atraumatic.  Eyes:     General: No scleral icterus.       Right  eye: No discharge.        Left eye: No discharge.     Conjunctiva/sclera: Conjunctivae normal.     Pupils: Pupils are equal, round, and reactive to light.  Cardiovascular:     Rate and Rhythm: Tachycardia present.  Pulmonary:     Effort: Pulmonary effort is normal. No respiratory distress.     Breath sounds: Normal breath sounds.  Abdominal:     General: There is no distension.     Palpations: Abdomen is soft.     Tenderness: There is no abdominal tenderness.  Musculoskeletal:     Cervical back: Normal range of motion.  Skin:    General: Skin is warm and dry.  Neurological:     Mental Status: She is alert and oriented to person, place, and time.  Psychiatric:        Behavior: Behavior normal.     ED Results / Procedures / Treatments   Labs (all labs ordered are listed, but only abnormal results are displayed) Labs Reviewed  COMPREHENSIVE METABOLIC PANEL - Abnormal; Notable for the following components:      Result Value   Glucose, Bld 111 (*)    Creatinine, Ser 1.04 (*)    Calcium 8.7 (*)    Albumin 3.4 (*)    GFR calc non Af Amer 57 (*)    All other components within normal limits  CBC WITH DIFFERENTIAL/PLATELET - Abnormal; Notable for the following components:   RBC 5.31 (*)    MCV 77.6 (*)    MCH 24.5 (*)    Platelets 504 (*)    All other components within normal limits    EKG EKG Interpretation  Date/Time:  Friday September 12 2019 12:00:29 EST Ventricular Rate:  100 PR Interval:    QRS Duration: 77 QT Interval:  341 QTC Calculation: 440 R Axis:   78 Text Interpretation: Sinus tachycardia Confirmed by Lennice Sites [phone]) on 09/12/2019 12:29:44 PM   Radiology DG Chest Portable 1 View  Result Date: 09/12/2019 CLINICAL DATA:  Shortness of breath EXAM: PORTABLE CHEST 1 VIEW COMPARISON:  09/04/2019 FINDINGS: Cardiomegaly. Bibasilar airspace opacities could reflect atelectasis or infiltrates. No effusions. No acute bony abnormality. IMPRESSION: Bibasilar  atelectasis or pneumonia.  Mild cardiomegaly. Electronically Signed   By: Rolm Baptise M.D.   On: 09/12/2019 12:26    Procedures Procedures (including critical care time)  Medications Ordered in ED Medications  ondansetron (ZOFRAN) injection 4 mg (0 mg Intravenous Hold 09/12/19 1319)  sodium chloride 0.9 % bolus 1,000 mL (1,000 mLs Intravenous  New Bag/Given 09/12/19 1319)    ED Course  I have reviewed the triage vital signs and the nursing notes.  Pertinent labs & imaging results that were available during my care of the patient were reviewed by me and considered in my medical decision making (see chart for details).  64 year old female with Covid presents with generalized weakness, fatigue and shortness of breath.  She reports oxygen at home has been transiently dipping into the high 80s but will go back up to the mid 90s.  In triage she is mildly tachycardic O2 sats are 98%.  When I am in the room with her sats are 94%.  She is clinically well-appearing.  Obtain chest x-ray, labs  Chest x-ray shows bibasilar atelectasis versus pneumonia with mild cardiomegaly.  Labs are overall reassuring.  Discussed with patient.  She is comfortable going home at this time.  She has a PCP she can follow-up with.  Advised continue to monitor her O2 sats and supportive care was discussed.  Advised return if worsening.  MDM Rules/Calculators/A&P                       Final Clinical Impression(s) / ED Diagnoses Final diagnoses:  [id number]    Rx / DC Orders ED Discharge Orders    None       Recardo Evangelist, PA-C 09/12/19 McGregor, Adam, DO 09/12/19 1556

## 2019-09-12 NOTE — ED Notes (Signed)
Pt ambulated in room O2 was 97-94% on RA. Pt seemed to be SOB when ambulating. Pt had steady gait when ambulating and no assistance was needed.

## 2019-09-12 NOTE — ED Triage Notes (Signed)
To ED for eval of continued sob since testing pos for covid 12/31. Pt states she is drinking wnl but decreased food intake. Denies diarrhea. Speaks in full sentences. States her home pulse ox is showing her high 80's. Pulse ox in triage showing 98%. States she was supposed to take antibiotic for pneumonia but it made her sick so she stopped taking it.

## 2019-09-12 NOTE — Discharge Instructions (Signed)
Please follow up with your PCP Continue to hydrate and take over the counter medicines for you symptoms Please continue to monitor your O2 sats and return if worsening

## 2019-12-04 DIAGNOSIS — I10 Essential (primary) hypertension: Secondary | ICD-10-CM | POA: Insufficient documentation

## 2019-12-04 DIAGNOSIS — E119 Type 2 diabetes mellitus without complications: Secondary | ICD-10-CM | POA: Insufficient documentation

## 2019-12-04 DIAGNOSIS — Z98891 History of uterine scar from previous surgery: Secondary | ICD-10-CM | POA: Insufficient documentation

## 2020-09-22 DIAGNOSIS — Z1159 Encounter for screening for other viral diseases: Secondary | ICD-10-CM | POA: Diagnosis not present

## 2020-09-22 DIAGNOSIS — N183 Chronic kidney disease, stage 3 unspecified: Secondary | ICD-10-CM | POA: Diagnosis not present

## 2020-09-22 DIAGNOSIS — Z23 Encounter for immunization: Secondary | ICD-10-CM | POA: Diagnosis not present

## 2020-09-22 DIAGNOSIS — E785 Hyperlipidemia, unspecified: Secondary | ICD-10-CM | POA: Diagnosis not present

## 2020-09-22 DIAGNOSIS — E559 Vitamin D deficiency, unspecified: Secondary | ICD-10-CM | POA: Diagnosis not present

## 2020-09-22 DIAGNOSIS — Z0000 Encounter for general adult medical examination without abnormal findings: Secondary | ICD-10-CM | POA: Diagnosis not present

## 2020-09-22 DIAGNOSIS — I129 Hypertensive chronic kidney disease with stage 1 through stage 4 chronic kidney disease, or unspecified chronic kidney disease: Secondary | ICD-10-CM | POA: Diagnosis not present

## 2020-09-22 DIAGNOSIS — J309 Allergic rhinitis, unspecified: Secondary | ICD-10-CM | POA: Diagnosis not present

## 2020-09-22 DIAGNOSIS — E1122 Type 2 diabetes mellitus with diabetic chronic kidney disease: Secondary | ICD-10-CM | POA: Diagnosis not present

## 2020-12-06 ENCOUNTER — Other Ambulatory Visit: Payer: Self-pay | Admitting: Obstetrics and Gynecology

## 2020-12-06 DIAGNOSIS — E2839 Other primary ovarian failure: Secondary | ICD-10-CM

## 2021-03-22 DIAGNOSIS — N183 Chronic kidney disease, stage 3 unspecified: Secondary | ICD-10-CM | POA: Diagnosis not present

## 2021-03-22 DIAGNOSIS — I129 Hypertensive chronic kidney disease with stage 1 through stage 4 chronic kidney disease, or unspecified chronic kidney disease: Secondary | ICD-10-CM | POA: Diagnosis not present

## 2021-03-22 DIAGNOSIS — E1122 Type 2 diabetes mellitus with diabetic chronic kidney disease: Secondary | ICD-10-CM | POA: Diagnosis not present

## 2021-03-22 DIAGNOSIS — E785 Hyperlipidemia, unspecified: Secondary | ICD-10-CM | POA: Diagnosis not present

## 2021-03-22 DIAGNOSIS — E559 Vitamin D deficiency, unspecified: Secondary | ICD-10-CM | POA: Diagnosis not present

## 2021-05-18 ENCOUNTER — Other Ambulatory Visit: Payer: 59

## 2021-06-18 DIAGNOSIS — Z23 Encounter for immunization: Secondary | ICD-10-CM | POA: Diagnosis not present

## 2021-08-08 ENCOUNTER — Other Ambulatory Visit: Payer: Self-pay

## 2021-08-08 ENCOUNTER — Ambulatory Visit (INDEPENDENT_AMBULATORY_CARE_PROVIDER_SITE_OTHER): Payer: Medicare Other

## 2021-08-08 ENCOUNTER — Ambulatory Visit
Admission: EM | Admit: 2021-08-08 | Discharge: 2021-08-08 | Disposition: A | Discharge status: Home / Self Care | Payer: Medicare Other | Attending: Physician Assistant | Admitting: Physician Assistant

## 2021-08-08 ENCOUNTER — Encounter: Payer: Self-pay | Admitting: Emergency Medicine

## 2021-08-08 DIAGNOSIS — S6992XA Unspecified injury of left wrist, hand and finger(s), initial encounter: Secondary | ICD-10-CM

## 2021-08-08 DIAGNOSIS — W19XXXA Unspecified fall, initial encounter: Secondary | ICD-10-CM | POA: Diagnosis not present

## 2021-08-08 DIAGNOSIS — M79632 Pain in left forearm: Secondary | ICD-10-CM

## 2021-08-08 DIAGNOSIS — Z043 Encounter for examination and observation following other accident: Secondary | ICD-10-CM | POA: Diagnosis not present

## 2021-08-08 DIAGNOSIS — M79642 Pain in left hand: Secondary | ICD-10-CM | POA: Diagnosis not present

## 2021-08-08 NOTE — ED Provider Notes (Signed)
[facility]    CSN: [id number] Arrival date & time: 08/08/21  1115      History   Chief Complaint Chief Complaint  Patient presents with   Hand Injury    HPI Shannon [name] is a 65 y.o. female.   Patient here today for evaluation of left hand and left forearm pain that started about a week ago after she fell.  She states that she fell onto an outstretched hand.  She noted that she is did have some mild pain at the base of her thumb prior to the fall but then fall worsen pain.  She has not had any numbness or tingling.  She has tried Tylenol with mild relief.  She states overall she does not have significant pain unless she is lifting her heavy grandchild or with other movements of her thumb.  She does state that sometimes she will have pain at nighttime.  The history is provided by the patient.  Hand Injury Associated symptoms: no fever    Past Medical History:  Diagnosis Date   Allergy    Arrhythmia    atrial fibrillation   Hypertension    PAC (premature atrial contraction)    PVC (premature ventricular contraction)     Patient Active Problem List   Diagnosis Date Noted   PAF (paroxysmal atrial fibrillation) ([place]) 01/27/2011   Dyspnea 01/27/2011    Past Surgical History:  Procedure Laterality Date   BREAST CYST EXCISION Bilateral    [place] &1977    OB History   No obstetric history on file.      Home Medications    Prior to Admission medications   Medication Sig Start Date End Date Taking? Authorizing Provider  albuterol (VENTOLIN HFA) 108 (90 Base) MCG/ACT inhaler Inhale 2 puffs into the lungs every 4 (four) hours as needed for wheezing or shortness of breath. 09/04/19   Ok Edwards, PA-C  aspirin 325 MG tablet Take 325 mg by mouth daily.      [provider]  benazepril (LOTENSIN) 10 MG tablet Take 10 mg by mouth daily.    [provider]  cetirizine (ZYRTEC) 10 MG tablet Take 10 mg by mouth daily.     [provider]  doxycycline (VIBRAMYCIN) 100 MG capsule Take 1 capsule (100 mg total) by mouth 2 (two) times daily. 09/04/19   Tasia Catchings, Amy V, PA-C  fish oil-omega-3 fatty acids 1000 MG capsule Take 2 g by mouth daily.      [provider]    Family History Family History  Problem Relation Age of Onset   Heart disease Mother        age 67   Hypertension Mother    Diabetes Mother    Heart disease Father        age 58   Hypertension Brother    Hypertension Maternal Grandmother    Diabetes Maternal Grandfather     Social History Social History   Tobacco Use   Smoking status: Former    Types: Cigarettes    Quit date: 09/04/1988    Years since quitting: 32.9   Smokeless tobacco: Never  Substance Use Topics   Alcohol use: No   Drug use: No     Allergies   Codeine   Review of Systems Review of Systems  Constitutional:  Negative for chills and fever.  Eyes:  Negative for discharge and redness.  Respiratory:  Negative for shortness of breath.   Gastrointestinal:  Negative for abdominal pain, nausea and vomiting.  Genitourinary:  Positive for vaginal bleeding and vaginal discharge.  Musculoskeletal:  Positive for arthralgias. Negative for joint swelling.  Neurological:  Negative for numbness.    Physical Exam Triage Vital Signs ED Triage Vitals  Enc Vitals Group     BP 08/08/21 1204 131/84     Pulse Rate 08/08/21 1204 82     Resp 08/08/21 1204 16     Temp 08/08/21 1204 97.6 F (36.4 C)     Temp Source 08/08/21 1204 Oral     SpO2 08/08/21 1204 95 %     Weight --      Height --      Head Circumference --      Peak Flow --      Pain Score 08/08/21 1205 5     Pain Loc --      Pain Edu? --      Excl. in [place]? --    No data found.  Updated Vital Signs BP 131/84 (BP Location: Left Arm)   Pulse 82   Temp 97.6 F (36.4 C) (Oral)   Resp 16   SpO2 95%      Physical Exam Vitals and nursing note reviewed.  Constitutional:      General: She is not  in acute distress.    Appearance: Normal appearance. She is not ill-appearing.  HENT:     Head: Normocephalic and atraumatic.  Eyes:     Conjunctiva/sclera: Conjunctivae normal.  Cardiovascular:     Rate and Rhythm: Normal rate.  Pulmonary:     Effort: Pulmonary effort is normal.  Musculoskeletal:     Comments: Full ROM of left wrist and left fingers  Skin:    Capillary Refill: Normal cap refill to left fingers Neurological:     Mental Status: She is alert.     Comments: Gross sensation intact to left fingers distally  Psychiatric:        Mood and Affect: Mood normal.        Behavior: Behavior normal.        Thought Content: Thought content normal.     UC Treatments / Results  Labs (all labs ordered are listed, but only abnormal results are displayed) Labs Reviewed - No data to display  EKG   Radiology DG Forearm Left  Result Date: 08/08/2021 CLINICAL DATA:  [place] onto outstretched hand EXAM: LEFT FOREARM - 2 VIEW COMPARISON:  None. FINDINGS: No fracture or dislocation. Degenerative changes noted at the first carpometacarpal articulation. No radiodense foreign body or subcutaneous gas. Regional soft tissues unremarkable. IMPRESSION: No acute findings Electronically Signed   By: Lucrezia Europe M.D.   On: 08/08/2021 12:27   DG Hand Complete Left  Result Date: 08/08/2021 CLINICAL DATA:  [place] onto outstretched hand EXAM: LEFT HAND - COMPLETE 3+ VIEW COMPARISON:  None. FINDINGS: Negative for fracture or dislocation. Normal alignment and mineralization. Advanced DJD at the first [facility] articulation. There is mild widening of the scapholunate space suggesting ligamentous disruption. Dorsal spurring of the DIP joints of the index, ring and middle fingers. IMPRESSION: Negative. 1. Negative for fracture. 2. Widening of the scapholunate space suggesting ligamentous injury, age indeterminate. 3. Degenerative changes as above. Electronically Signed   By: Lucrezia Europe M.D.   On: 08/08/2021 12:29     Procedures Procedures (including critical care time)  Medications Ordered in UC Medications - No data to display  Initial Impression / Assessment and Plan / UC Course  I  have reviewed the triage vital signs and the nursing notes.  Pertinent labs & imaging results that were available during my care of the patient were reviewed by me and considered in my medical decision making (see chart for details).    Xray results as listed above. Recommended further evaluation by ortho given possible ligamentous injury and encouraged ibuprofen if needed for pain to hopefully help with inflammation as well. Thumb spica splint applied in office. Encouraged follow up with ortho with any further concerns.   Final Clinical Impressions(s) / UC Diagnoses   Final diagnoses:  Hand injury, left, initial encounter   Discharge Instructions   None    ED Prescriptions   None    PDMP not reviewed this encounter.   Francene Finders, PA-C 08/08/21 1407

## 2021-08-08 NOTE — ED Triage Notes (Signed)
Fall on out stretched hand one week prior, pain in left hand and arm since. States the pain started as an ache a week before the fall, but was then worsened by the fall. No obvious bruising or deformities visible in triage. Patient states she feels like her hand/arm are mildly swollen. Pain extends from palm into forearm.

## 2021-08-09 ENCOUNTER — Ambulatory Visit: Payer: Self-pay

## 2021-08-12 DIAGNOSIS — M1812 Unilateral primary osteoarthritis of first carpometacarpal joint, left hand: Secondary | ICD-10-CM | POA: Diagnosis not present

## 2021-08-12 DIAGNOSIS — M189 Osteoarthritis of first carpometacarpal joint, unspecified: Secondary | ICD-10-CM | POA: Insufficient documentation

## 2021-10-04 ENCOUNTER — Other Ambulatory Visit: Payer: Self-pay | Admitting: Family Medicine

## 2021-10-04 DIAGNOSIS — M1990 Unspecified osteoarthritis, unspecified site: Secondary | ICD-10-CM | POA: Diagnosis not present

## 2021-10-04 DIAGNOSIS — N183 Chronic kidney disease, stage 3 unspecified: Secondary | ICD-10-CM | POA: Diagnosis not present

## 2021-10-04 DIAGNOSIS — Z13820 Encounter for screening for osteoporosis: Secondary | ICD-10-CM

## 2021-10-04 DIAGNOSIS — Z1389 Encounter for screening for other disorder: Secondary | ICD-10-CM | POA: Diagnosis not present

## 2021-10-04 DIAGNOSIS — I129 Hypertensive chronic kidney disease with stage 1 through stage 4 chronic kidney disease, or unspecified chronic kidney disease: Secondary | ICD-10-CM | POA: Diagnosis not present

## 2021-10-04 DIAGNOSIS — Z0000 Encounter for general adult medical examination without abnormal findings: Secondary | ICD-10-CM | POA: Diagnosis not present

## 2021-10-04 DIAGNOSIS — B351 Tinea unguium: Secondary | ICD-10-CM | POA: Diagnosis not present

## 2021-10-04 DIAGNOSIS — Z1231 Encounter for screening mammogram for malignant neoplasm of breast: Secondary | ICD-10-CM

## 2021-10-04 DIAGNOSIS — E785 Hyperlipidemia, unspecified: Secondary | ICD-10-CM | POA: Diagnosis not present

## 2021-10-04 DIAGNOSIS — Z23 Encounter for immunization: Secondary | ICD-10-CM | POA: Diagnosis not present

## 2021-10-04 DIAGNOSIS — E1122 Type 2 diabetes mellitus with diabetic chronic kidney disease: Secondary | ICD-10-CM | POA: Diagnosis not present

## 2021-10-04 DIAGNOSIS — E559 Vitamin D deficiency, unspecified: Secondary | ICD-10-CM | POA: Diagnosis not present

## 2021-10-17 ENCOUNTER — Ambulatory Visit: Payer: Medicare Other | Admitting: Podiatry

## 2021-10-17 ENCOUNTER — Encounter: Payer: Self-pay | Admitting: Podiatry

## 2021-10-17 ENCOUNTER — Other Ambulatory Visit: Payer: Self-pay

## 2021-10-17 DIAGNOSIS — M79675 Pain in left toe(s): Secondary | ICD-10-CM

## 2021-10-17 DIAGNOSIS — B351 Tinea unguium: Secondary | ICD-10-CM | POA: Diagnosis not present

## 2021-10-18 NOTE — Progress Notes (Signed)
Subjective:   Patient ID: Shannon [name], female   DOB: 66 y.o.   MRN: [id number]   HPI Patient presents concerned about discoloration of nailbeds with his big toenails being the worst and her fifth nails also been a problem with thickness of the fifth nail left foot that is quite deformed and dystrophic with only minimal discomfort.  Patient does not smoke likes to be active   Review of Systems  All other systems reviewed and are negative.      Objective:  Physical Exam Vitals and nursing note reviewed.  Constitutional:      Appearance: She is well-developed.  Pulmonary:     Effort: Pulmonary effort is normal.  Musculoskeletal:        General: Normal range of motion.  Skin:    General: Skin is warm.  Neurological:     Mental Status: She is alert.    Neurovascular status was found to be intact muscle strength is adequate range of motion of the subtalar midtarsal joint is adequate.  Patient does have nail disease with discoloration of the beds with no current pain associated with them but concerned about thickness and discoloration pattern left over right.  Good digital perfusion well oriented x3     Assessment:  Mycotic nail infection bilateral with left being worse than right     Plan:  H&P education rendered at great length and discussed different treatment options available.  We are going to do laser we discussed oral which is possible or topical and we will start and see how she responds.  I did debride nails today to try to help reduce some of the thickness and she is to be seen by technician for the initiation of laser care after all instructions given and the consideration for oral pulse therapy or short-term oral Lamisil treatment

## 2021-11-15 ENCOUNTER — Telehealth: Payer: Self-pay | Admitting: Podiatry

## 2021-11-15 NOTE — Telephone Encounter (Signed)
Pharmacy [facility] on [address]  Patient would like to try meds for the nail fungus before starting laser treatments. Please advise

## 2021-11-16 ENCOUNTER — Other Ambulatory Visit: Payer: Self-pay | Admitting: Podiatry

## 2021-11-16 MED ORDER — TERBINAFINE HCL 250 MG PO TABS: ORAL_TABLET | ORAL | 0 refills | Status: AC

## 2021-11-18 ENCOUNTER — Other Ambulatory Visit: Payer: Medicare Other

## 2021-12-16 ENCOUNTER — Ambulatory Visit (HOSPITAL_COMMUNITY)
Admission: EM | Admit: 2021-12-16 | Discharge: 2021-12-16 | Disposition: A | Discharge status: Home / Self Care | Payer: Medicare Other | Attending: Nurse Practitioner | Admitting: Nurse Practitioner

## 2021-12-16 ENCOUNTER — Encounter (HOSPITAL_COMMUNITY): Payer: Self-pay | Admitting: Emergency Medicine

## 2021-12-16 DIAGNOSIS — J019 Acute sinusitis, unspecified: Secondary | ICD-10-CM | POA: Diagnosis not present

## 2021-12-16 DIAGNOSIS — R051 Acute cough: Secondary | ICD-10-CM

## 2021-12-16 MED ORDER — PSEUDOEPH-BROMPHEN-DM 30-2-10 MG/5ML PO SYRP: 5.0000 mL | ORAL_SOLUTION | Freq: Four times a day (QID) | ORAL | 0 refills | Status: DC | PRN

## 2021-12-16 MED ORDER — FLUTICASONE PROPIONATE 50 MCG/ACT NA SUSP: 2.0000 | Freq: Every day | NASAL | 0 refills | Status: AC

## 2021-12-16 MED ORDER — AMOXICILLIN-POT CLAVULANATE 875-125 MG PO TABS: 1.0000 | ORAL_TABLET | Freq: Two times a day (BID) | ORAL | 0 refills | Status: AC

## 2021-12-16 NOTE — Discharge Instructions (Signed)
Take medication as prescribed. ?Increase fluids and get plenty of rest. ?Use humidifier at bedtime to help with cough.  You may also want to sleep elevated on 2 pillows. ?Follow-up if your symptoms do not improve. ?

## 2021-12-16 NOTE — ED Provider Notes (Signed)
?[facility] ? ? ? ?CSN: [id number] ?Arrival date & time: 12/16/21  1340 ? ? ?  ? ?History   ?Chief Complaint ?Chief Complaint  ?Patient presents with  ? Cough  ? ? ?HPI ?Shannon [name] is a 66 y.o. female.  ? ?The patient is a 66 year old female who presents for sinus symptoms and cough.  Patient states symptoms have been present over the past week.  She states that her symptoms started with nasal congestion, runny nose, and postnasal drainage.  Since that time, she has now developed a cough that is productive of yellow sputum.  She states the sputum was dark green but it is now yellow.  She states the cough is persistent throughout the day.  She also endorses wheezing with the cough.  She states she is coughing so hard she is eating herself.  She denies fever, chills, ear pain, or GI symptoms.  Patient states she does have a history of pneumonia.  She has been taking over-the-counter daytime and nighttime cough medicines, but she states her cough worsened when she started that medication.  She has received 3 COVID vaccines, her pneumonia vaccine, and flu vaccine. ? ? ? ?Past Medical History:  ?Diagnosis Date  ? Allergy   ? Arrhythmia   ? atrial fibrillation  ? Hypertension   ? PAC (premature atrial contraction)   ? PVC (premature ventricular contraction)   ? ? ?Patient Active Problem List  ? Diagnosis Date Noted  ? PAF (paroxysmal atrial fibrillation) ([place]) 01/27/2011  ? Dyspnea 01/27/2011  ? ? ?Past Surgical History:  ?Procedure Laterality Date  ? BREAST CYST EXCISION Bilateral   ? [place] &1977  ? ? ?OB History   ?No obstetric history on file. ?  ? ? ? ?Home Medications   ? ?Prior to Admission medications   ?Medication Sig Start Date End Date Taking? Authorizing Provider  ?amoxicillin-clavulanate (AUGMENTIN) 875-125 MG tablet Take 1 tablet by mouth every 12 (twelve) hours. 12/16/21  Yes Tami Blass-Warren, Alda Lea, NP  ?brompheniramine-pseudoephedrine-DM 30-2-10 MG/5ML syrup Take 5 mLs by  mouth 4 (four) times daily as needed. 12/16/21  Yes Teo Moede-Warren, Alda Lea, NP  ?fluticasone (FLONASE) 50 MCG/ACT nasal spray Place 2 sprays into both nostrils daily. 12/16/21  Yes Yair Dusza-Warren, Alda Lea, NP  ?albuterol (VENTOLIN HFA) 108 (90 Base) MCG/ACT inhaler Inhale 2 puffs into the lungs every 4 (four) hours as needed for wheezing or shortness of breath. 09/04/19   Tasia Catchings, Amy V, PA-C  ?aspirin 325 MG tablet Take 325 mg by mouth daily.      [provider]  ?benazepril (LOTENSIN) 10 MG tablet Take 10 mg by mouth daily.    [provider]  ?cetirizine (ZYRTEC) 10 MG tablet Take 10 mg by mouth daily.    [provider]  ?doxycycline (VIBRAMYCIN) 100 MG capsule Take 1 capsule (100 mg total) by mouth 2 (two) times daily. 09/04/19   Ok Edwards, PA-C  ?fish oil-omega-3 fatty acids 1000 MG capsule Take 2 g by mouth daily.      [provider]  ?terbinafine (LAMISIL) 250 MG tablet Please take one a day x 7days, repeat every 4 weeks x 4 months 11/16/21   Wallene Huh, DPM  ? ? ?Family History ?Family History  ?Problem Relation Age of Onset  ? Heart disease Mother   ?     age 77  ? Hypertension Mother   ? Diabetes Mother   ? Heart disease Father   ?  age 38  ? Hypertension Brother   ? Hypertension Maternal Grandmother   ? Diabetes Maternal Grandfather   ? ? ?Social History ?Social History  ? ?Tobacco Use  ? Smoking status: Former  ?  Types: Cigarettes  ?  Quit date: 09/04/1988  ?  Years since quitting: 33.3  ? Smokeless tobacco: Never  ?Substance Use Topics  ? Alcohol use: No  ? Drug use: No  ? ? ? ?Allergies   ?Codeine ? ? ?Review of Systems ?Review of Systems  ?Constitutional: Negative.   ?HENT:  Positive for congestion, postnasal drip, rhinorrhea and sinus pressure. Negative for sinus pain.   ?Eyes: Negative.   ?Respiratory:  Positive for cough and wheezing.   ?Cardiovascular: Negative.   ?Gastrointestinal: Negative.   ?Skin: Negative.   ?Psychiatric/Behavioral: Negative.     ? ? ?Physical Exam ?Triage Vital Signs ?ED Triage Vitals  ?Enc Vitals Group  ?   BP 12/16/21 1442 (!) 160/93  ?   Pulse Rate 12/16/21 1442 88  ?   Resp 12/16/21 1442 18  ?   Temp 12/16/21 1442 98.4 ?F (36.9 ?C)  ?   Temp Source 12/16/21 1442 Oral  ?   SpO2 12/16/21 1442 98 %  ?   Weight --   ?   Height --   ?   Head Circumference --   ?   Peak Flow --   ?   Pain Score 12/16/21 1441 0  ?   Pain Loc --   ?   Pain Edu? --   ?   Excl. in [place]? --   ? ?No data found. ? ?Updated Vital Signs ?BP (!) 160/93 (BP Location: Left Arm)   Pulse 88   Temp 98.4 ?F (36.9 ?C) (Oral)   Resp 18   SpO2 98%  ? ?Visual Acuity ?Right Eye Distance:   ?Left Eye Distance:   ?Bilateral Distance:   ? ?Right Eye Near:   ?Left Eye Near:    ?Bilateral Near:    ? ?Physical Exam ?Vitals reviewed.  ?Constitutional:   ?   Appearance: Normal appearance.  ?HENT:  ?   Head: Normocephalic and atraumatic.  ?   Right Ear: Tympanic membrane, ear canal and external ear normal.  ?   Left Ear: Tympanic membrane, ear canal and external ear normal.  ?   Nose: Congestion present. No rhinorrhea.  ?   Right Turbinates: Enlarged and swollen.  ?   Left Turbinates: Enlarged and swollen.  ?   Right Sinus: No maxillary sinus tenderness or frontal sinus tenderness.  ?   Left Sinus: No maxillary sinus tenderness or frontal sinus tenderness.  ?   Mouth/Throat:  ?   Mouth: Mucous membranes are moist.  ?Eyes:  ?   Extraocular Movements: Extraocular movements intact.  ?   Conjunctiva/sclera: Conjunctivae normal.  ?   Pupils: Pupils are equal, round, and reactive to light.  ?Cardiovascular:  ?   Rate and Rhythm: Normal rate and regular rhythm.  ?   Pulses: Normal pulses.  ?   Heart sounds: Normal heart sounds.  ?Pulmonary:  ?   Effort: Pulmonary effort is normal. No respiratory distress.  ?   Breath sounds: Normal breath sounds. No wheezing or rales.  ?Abdominal:  ?   General: Bowel sounds are normal.  ?   Palpations: Abdomen is soft.  ?Musculoskeletal:  ?   Cervical back:  Normal range of motion.  ?Skin: ?   General: Skin is warm and dry.  ?  Capillary Refill: Capillary refill takes less than 2 seconds.  ?Neurological:  ?   General: No focal deficit present.  ?   Mental Status: She is alert and oriented to person, place, and time.  ?Psychiatric:     ?   Mood and Affect: Mood normal.     ?   Behavior: Behavior normal.  ? ? ? ?UC Treatments / Results  ?Labs ?(all labs ordered are listed, but only abnormal results are displayed) ?Labs Reviewed - No data to display ? ?EKG ? ? ?Radiology ?No results found. ? ?Procedures ?Procedures (including critical care time) ? ?Medications Ordered in UC ?Medications - No data to display ? ?Initial Impression / Assessment and Plan / UC Course  ?I have reviewed the triage vital signs and the nursing notes. ? ?Pertinent labs & imaging results that were available during my care of the patient were reviewed by me and considered in my medical decision making (see chart for details). ? ?The patient is a 66 year old female who presents for upper respiratory symptoms.  Symptoms have been present for the past week.  Patient states that she thought her symptoms were improving with over-the-counter medicine, but they have gotten worse.  She states the cough is worse in the color of her sputum has since changed.  She continues to have nasal congestion and postnasal drainage.  She has not had a fever since onset of her symptoms.  Her vital signs are stable, she is in no acute distress.  Her symptoms are consistent with a sinusitis, we will consider this to be bacterial in nature based on the duration of her symptoms.  COVID test and influenza test was not performed as it will not change the course of treatment.  Accordingly, she was prescribed Augmentin fluticasone, and Bromfed.  Patient advised to continue supportive care to include increasing rest and getting plenty of fluids.  Also recommend using humidifier and sleeping elevated on 2 pillows at bedtime.   Patient advised to follow-up if symptoms do not improve. ?Final Clinical Impressions(s) / UC Diagnoses  ? ?Final diagnoses:  ?Acute sinusitis, recurrence not specified, unspecified location  ? ?Discharge Instructions

## 2021-12-16 NOTE — ED Triage Notes (Signed)
Pt had cough that is productive with green that has turned to yellow phlegm.  ?

## 2022-01-11 DIAGNOSIS — E1122 Type 2 diabetes mellitus with diabetic chronic kidney disease: Secondary | ICD-10-CM | POA: Diagnosis not present

## 2022-01-11 DIAGNOSIS — E559 Vitamin D deficiency, unspecified: Secondary | ICD-10-CM | POA: Diagnosis not present

## 2022-02-24 ENCOUNTER — Ambulatory Visit: Payer: Medicare Other

## 2022-02-24 ENCOUNTER — Other Ambulatory Visit: Payer: Medicare Other

## 2022-07-10 DIAGNOSIS — E1169 Type 2 diabetes mellitus with other specified complication: Secondary | ICD-10-CM | POA: Diagnosis not present

## 2022-07-10 DIAGNOSIS — E1122 Type 2 diabetes mellitus with diabetic chronic kidney disease: Secondary | ICD-10-CM | POA: Diagnosis not present

## 2022-07-10 DIAGNOSIS — E559 Vitamin D deficiency, unspecified: Secondary | ICD-10-CM | POA: Diagnosis not present

## 2022-07-10 DIAGNOSIS — N183 Chronic kidney disease, stage 3 unspecified: Secondary | ICD-10-CM | POA: Diagnosis not present

## 2022-07-10 DIAGNOSIS — E785 Hyperlipidemia, unspecified: Secondary | ICD-10-CM | POA: Diagnosis not present

## 2022-07-10 DIAGNOSIS — J309 Allergic rhinitis, unspecified: Secondary | ICD-10-CM | POA: Diagnosis not present

## 2022-07-10 DIAGNOSIS — I129 Hypertensive chronic kidney disease with stage 1 through stage 4 chronic kidney disease, or unspecified chronic kidney disease: Secondary | ICD-10-CM | POA: Diagnosis not present

## 2022-08-07 ENCOUNTER — Ambulatory Visit
Admission: RE | Admit: 2022-08-07 | Discharge: 2022-08-07 | Disposition: A | Discharge status: Home / Self Care | Payer: Medicare Other | Source: Ambulatory Visit | Attending: Family Medicine | Admitting: Family Medicine

## 2022-08-07 DIAGNOSIS — Z1231 Encounter for screening mammogram for malignant neoplasm of breast: Secondary | ICD-10-CM

## 2022-08-07 DIAGNOSIS — Z780 Asymptomatic menopausal state: Secondary | ICD-10-CM | POA: Diagnosis not present

## 2022-08-07 DIAGNOSIS — Z13820 Encounter for screening for osteoporosis: Secondary | ICD-10-CM

## 2022-09-08 DIAGNOSIS — J309 Allergic rhinitis, unspecified: Secondary | ICD-10-CM | POA: Diagnosis not present

## 2022-09-08 DIAGNOSIS — R059 Cough, unspecified: Secondary | ICD-10-CM | POA: Diagnosis not present

## 2022-09-08 DIAGNOSIS — E1169 Type 2 diabetes mellitus with other specified complication: Secondary | ICD-10-CM | POA: Diagnosis not present

## 2022-09-08 DIAGNOSIS — J45909 Unspecified asthma, uncomplicated: Secondary | ICD-10-CM | POA: Diagnosis not present

## 2022-10-04 IMAGING — DX DG HAND COMPLETE 3+V*L*
3 series · 3 of 3 positions shown · non-contrast
Comparison: None.

CLINICAL DATA: Fell onto outstretched hand

EXAM:
LEFT HAND - COMPLETE 3+ VIEW

[hand pa]
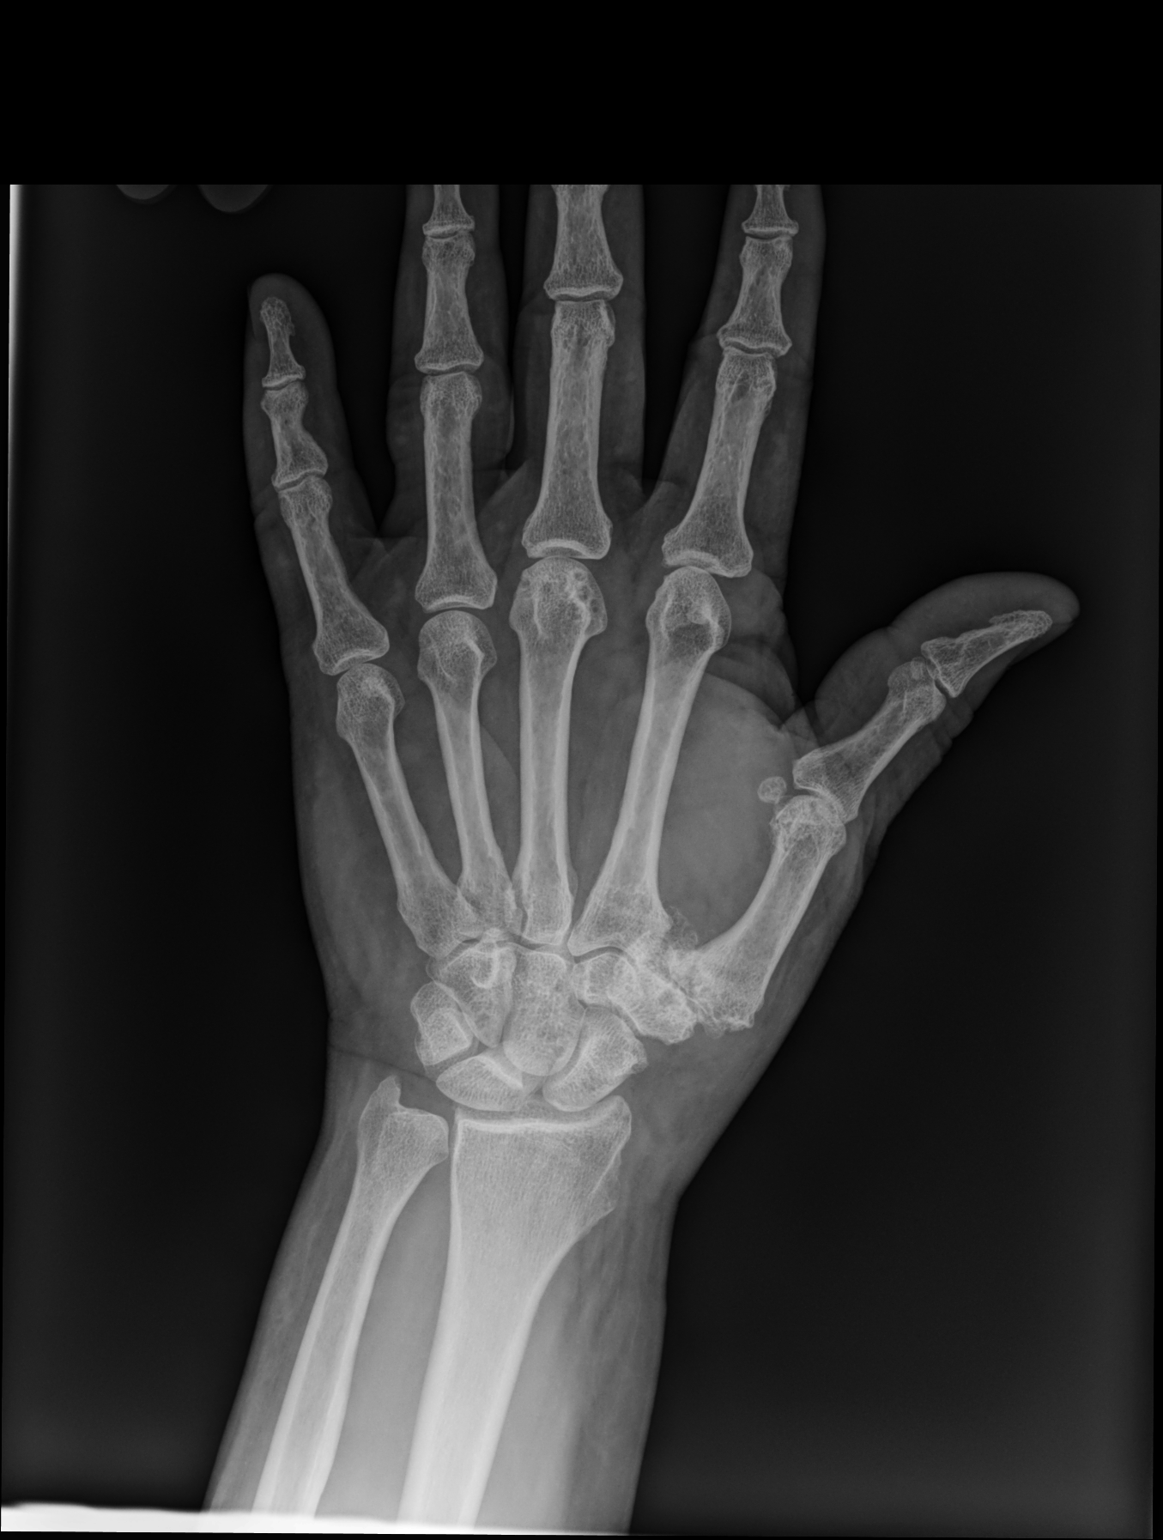

[hand mlo]
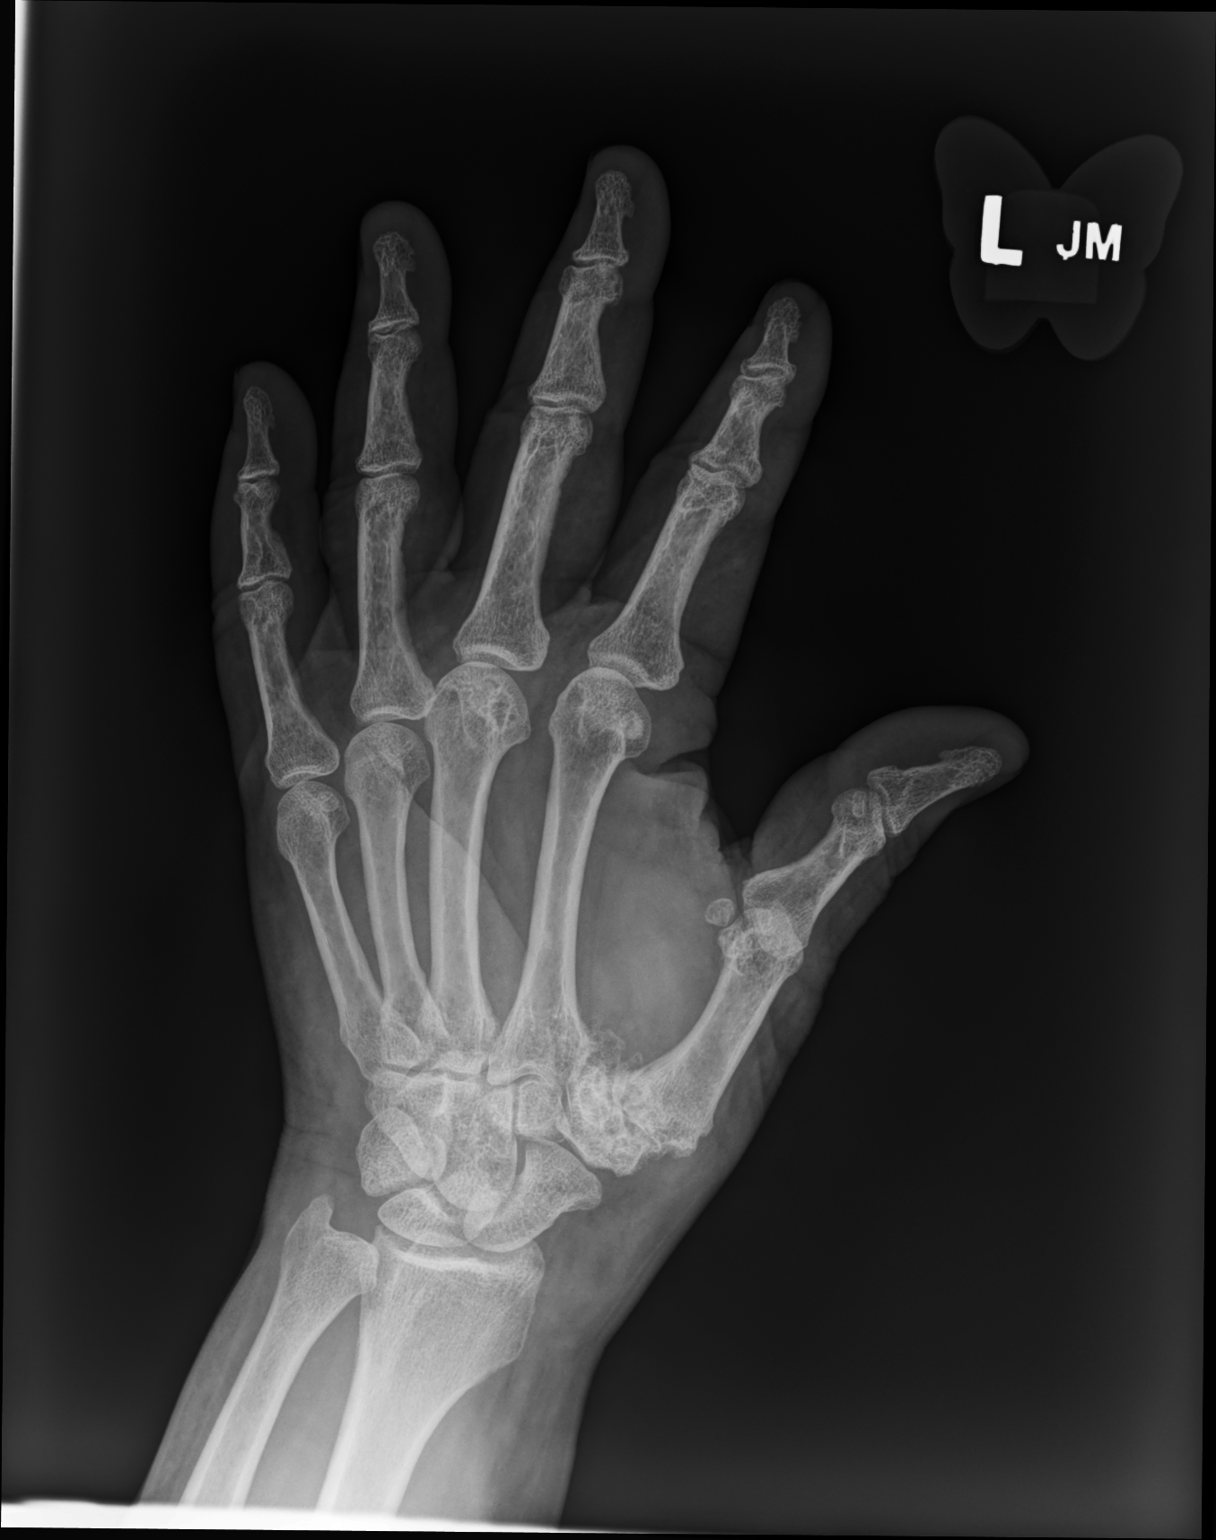

[hand lat]
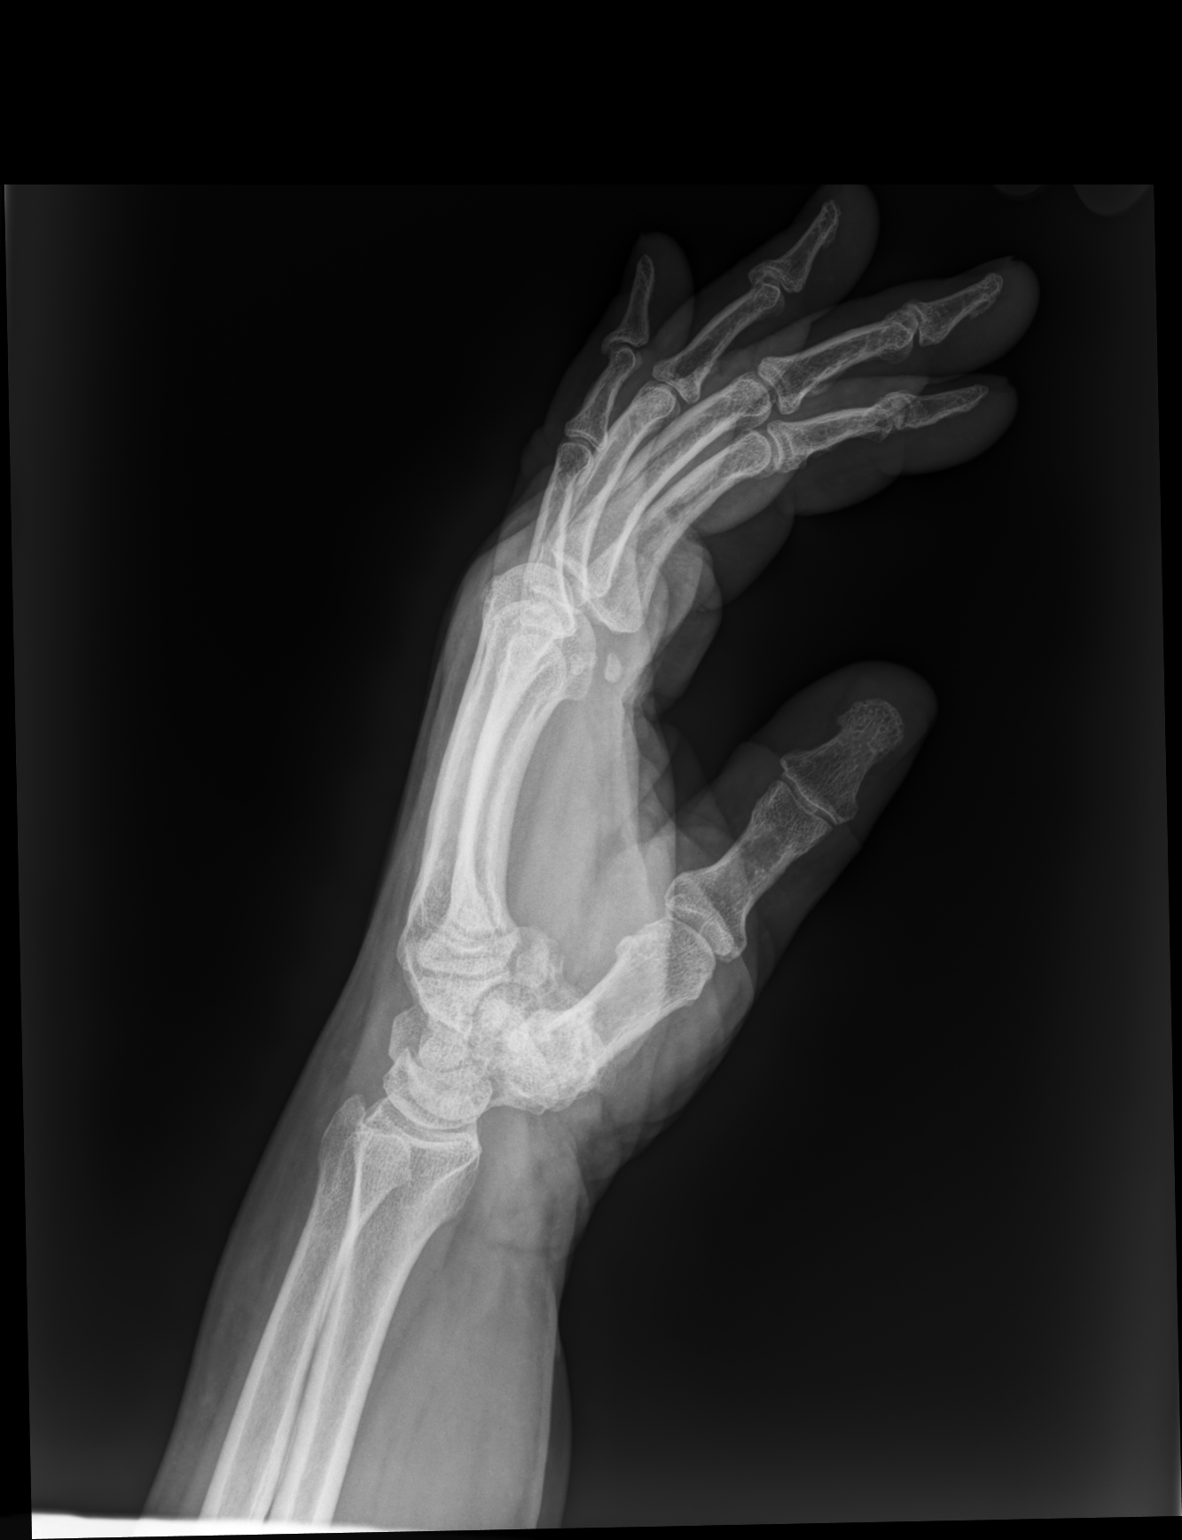

[3 of 3 positions shown; findings below may reference images not displayed]

FINDINGS: Negative for fracture or dislocation. Normal alignment and
mineralization. Advanced DJD at the first [facility] articulation. There is
mild widening of the scapholunate space suggesting ligamentous
disruption. Dorsal spurring of the DIP joints of the index, ring and
middle fingers.
IMPRESSION: Negative.

1. Negative for fracture.
2. Widening of the scapholunate space suggesting ligamentous injury,
age indeterminate.
3. Degenerative changes as above.

## 2022-10-04 IMAGING — DX DG FOREARM 2V*L*
2 series · 2 of 2 positions shown · non-contrast
Comparison: None.

CLINICAL DATA: Fell onto outstretched hand

EXAM:
LEFT FOREARM - 2 VIEW

[forearm ap]
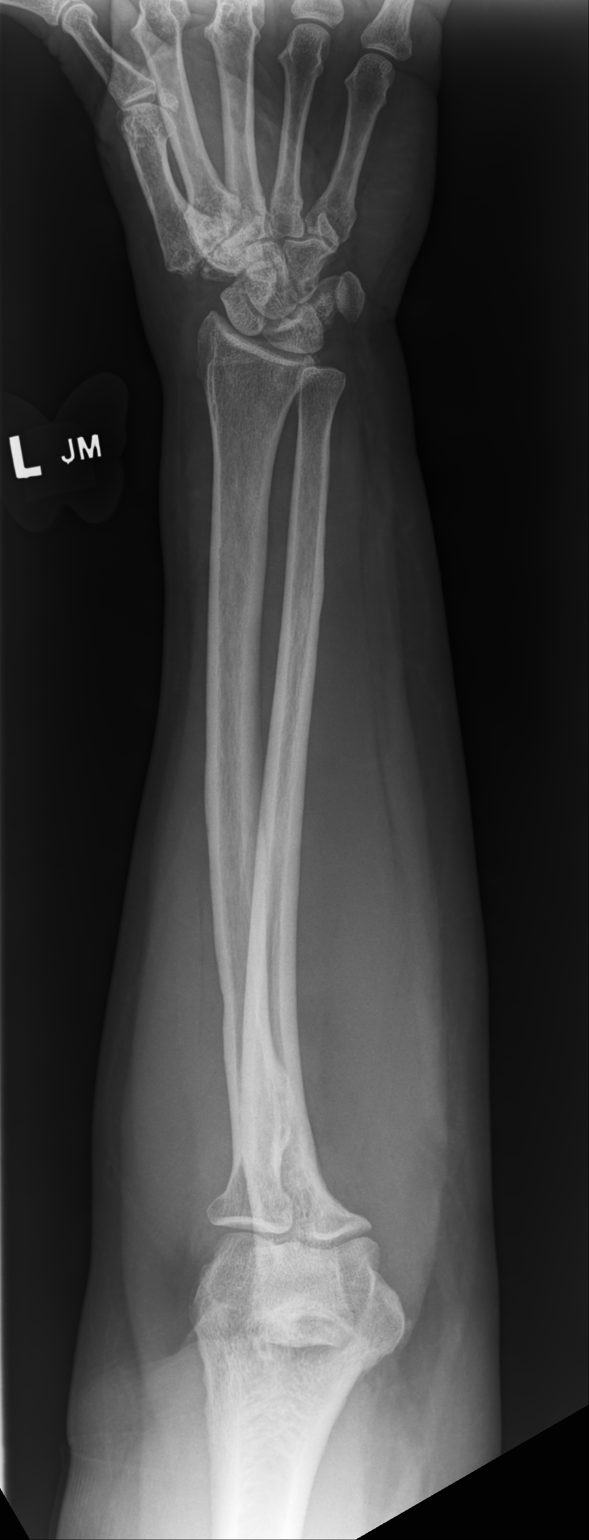

[forearm lat]
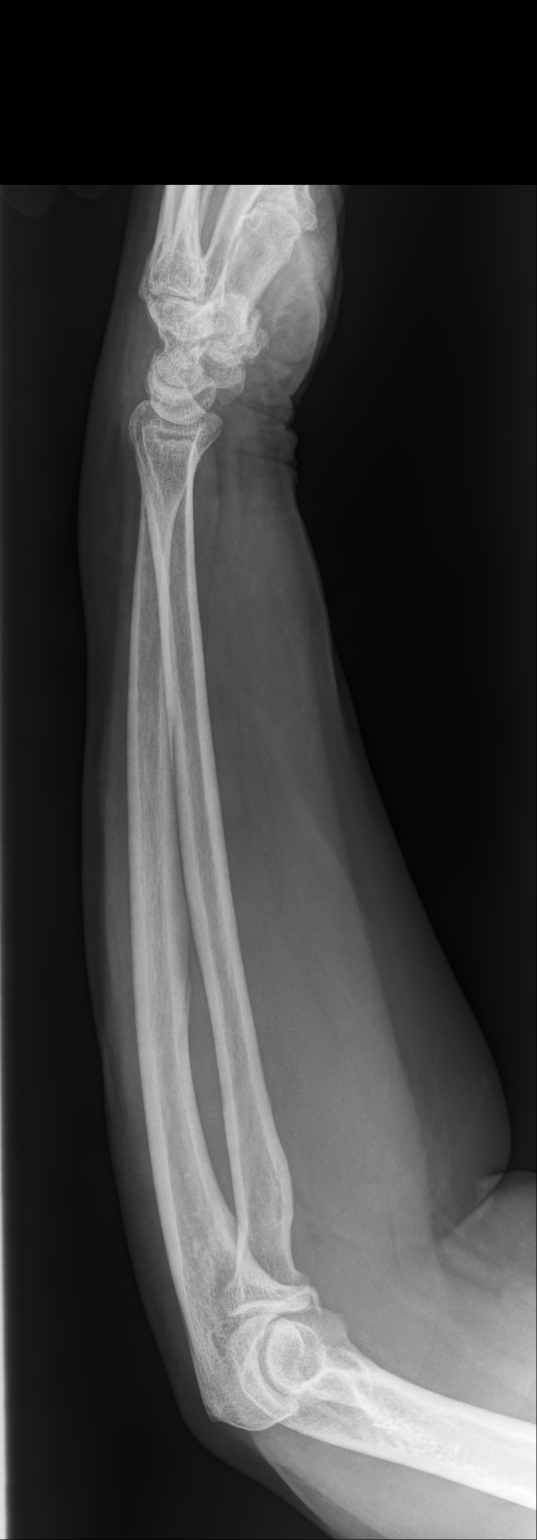

[2 of 2 positions shown; findings below may reference images not displayed]

FINDINGS: No fracture or dislocation. Degenerative changes noted at the first
carpometacarpal articulation. No radiodense foreign body or
subcutaneous gas. Regional soft tissues unremarkable.
IMPRESSION: No acute findings

## 2022-10-10 DIAGNOSIS — N183 Chronic kidney disease, stage 3 unspecified: Secondary | ICD-10-CM | POA: Diagnosis not present

## 2022-10-10 DIAGNOSIS — E785 Hyperlipidemia, unspecified: Secondary | ICD-10-CM | POA: Diagnosis not present

## 2022-10-10 DIAGNOSIS — E559 Vitamin D deficiency, unspecified: Secondary | ICD-10-CM | POA: Diagnosis not present

## 2022-10-10 DIAGNOSIS — E1122 Type 2 diabetes mellitus with diabetic chronic kidney disease: Secondary | ICD-10-CM | POA: Diagnosis not present

## 2022-10-10 DIAGNOSIS — E1165 Type 2 diabetes mellitus with hyperglycemia: Secondary | ICD-10-CM | POA: Diagnosis not present

## 2023-04-10 DIAGNOSIS — E785 Hyperlipidemia, unspecified: Secondary | ICD-10-CM | POA: Diagnosis not present

## 2023-04-10 DIAGNOSIS — E1122 Type 2 diabetes mellitus with diabetic chronic kidney disease: Secondary | ICD-10-CM | POA: Diagnosis not present

## 2023-04-10 DIAGNOSIS — Z9181 History of falling: Secondary | ICD-10-CM | POA: Diagnosis not present

## 2023-04-10 DIAGNOSIS — Z0000 Encounter for general adult medical examination without abnormal findings: Secondary | ICD-10-CM | POA: Diagnosis not present

## 2023-04-10 DIAGNOSIS — E559 Vitamin D deficiency, unspecified: Secondary | ICD-10-CM | POA: Diagnosis not present

## 2023-04-10 DIAGNOSIS — I129 Hypertensive chronic kidney disease with stage 1 through stage 4 chronic kidney disease, or unspecified chronic kidney disease: Secondary | ICD-10-CM | POA: Diagnosis not present

## 2023-04-10 DIAGNOSIS — N183 Chronic kidney disease, stage 3 unspecified: Secondary | ICD-10-CM | POA: Diagnosis not present

## 2023-04-10 DIAGNOSIS — E1165 Type 2 diabetes mellitus with hyperglycemia: Secondary | ICD-10-CM | POA: Diagnosis not present

## 2023-08-14 ENCOUNTER — Ambulatory Visit (INDEPENDENT_AMBULATORY_CARE_PROVIDER_SITE_OTHER): Payer: Medicare Other

## 2023-08-14 ENCOUNTER — Other Ambulatory Visit: Payer: Self-pay

## 2023-08-14 ENCOUNTER — Ambulatory Visit
Admission: EM | Admit: 2023-08-14 | Discharge: 2023-08-14 | Disposition: A | Discharge status: Home / Self Care | Payer: Medicare Other | Attending: Family Medicine | Admitting: Family Medicine

## 2023-08-14 ENCOUNTER — Encounter: Payer: Self-pay | Admitting: Emergency Medicine

## 2023-08-14 DIAGNOSIS — M7661 Achilles tendinitis, right leg: Secondary | ICD-10-CM

## 2023-08-14 MED ORDER — PREDNISONE 10 MG (21) PO TBPK: ORAL_TABLET | Freq: Every day | ORAL | 0 refills | Status: AC

## 2023-08-14 NOTE — ED Triage Notes (Signed)
Pt sts pain in right heel worse x 1 month; pt sts hx of heel spur in past; pt denies new obvious injury

## 2023-08-15 NOTE — ED Provider Notes (Signed)
[facility]   [id number] 08/14/23 Arrival Time: 1451  ASSESSMENT & PLAN:  1. Achilles tendinitis of right lower extremity    Placed in boot for comfort. Trial of: Discharge Medication List as of 08/14/2023  6:18 PM     START taking these medications   Details  predniSONE (STERAPRED UNI-PAK 21 TAB) 10 MG (21) TBPK tablet Take by mouth daily. Take as directed., Starting Tue 08/14/2023, Normal       I have personally viewed and independently interpreted the imaging studies ordered this visit. R foot: no acute bony changes.  Orders Placed This Encounter  Procedures   DG Foot Complete Right   Apply CAM boot   Recommend:  Follow-up Information     Schedule an appointment as soon as possible for a visit  with Ortho, [facility].   Specialty: Specialist Contact information: [address] [place] [address] [phone]                Reviewed expectations re: course of current medical issues. Questions answered. Outlined signs and symptoms indicating need for more acute intervention. Patient verbalized understanding. After Visit Summary given.  SUBJECTIVE: History from: patient. Shannon [name] is a 67 y.o. female who reports pain in right heel worse x 1 month; pt sts hx of heel spur in past; pt denies new obvious injury  "Just sore." Denies injury. No tx PTA.  Past Surgical History:  Procedure Laterality Date   BREAST CYST EXCISION Bilateral    CESAREAN SECTION  1971 &1977      OBJECTIVE:  Vitals:   08/14/23 1715  BP: (!) 153/91  Pulse: 79  Resp: 18  Temp: 98 F (36.7 C)  TempSrc: Oral  SpO2: 95%    General appearance: alert; no distress HEENT: [place]; AT Neck: supple with FROM Resp: unlabored respirations Extremities: RLE: warm with well perfused appearance; well localized moderate tenderness over right Achilles insertion at heel; without gross deformities; swelling: none; bruising: none; ankle ROM: normal, with  discomfort CV: brisk extremity capillary refill of RLE; 2+ DP pulse of RLE. Skin: warm and dry; no visible rashes Neurologic: normal sensation and strength of RLE Psychological: alert and cooperative; normal mood and affect  Imaging: DG Foot Complete Right  Result Date: 08/14/2023 CLINICAL DATA:  Right heel pain for 1 month EXAM: RIGHT FOOT COMPLETE - 3+ VIEW COMPARISON:  None Available. FINDINGS: Frontal, oblique, and lateral views of the right foot are obtained. No fracture, subluxation, or dislocation. Small inferior enlarged superior calcaneal spurs are noted. Joint space narrowing and osteophyte formation within the midfoot consistent with osteoarthritis. Soft tissues are unremarkable. IMPRESSION: 1. Prominent superior and inferior calcaneal spurs. 2. Midfoot osteoarthritis. 3. No acute fracture. Electronically Signed   By: Shannon [name] M.D.   On: 08/14/2023 18:53      Allergies  Allergen Reactions   Codeine     Past Medical History:  Diagnosis Date   Allergy    Arrhythmia    atrial fibrillation   Hypertension    PAC (premature atrial contraction)    PVC (premature ventricular contraction)    Social History   Socioeconomic History   Marital status: Married    Spouse name: Archivist   Number of children: 2   Years of education: Associates   Highest education level: Not on file  Occupational History   Occupation: factory Location manager  Tobacco Use   Smoking status: Former    Current packs/day: 0.00    Types: Cigarettes  Quit date: 09/04/1988    Years since quitting: 34.9   Smokeless tobacco: Never  Substance and Sexual Activity   Alcohol use: No   Drug use: No   Sexual activity: Not on file  Other Topics Concern   Not on file  Social History Narrative   Lives with her husband and her granddaughter and great grandson Shannon [name]).   Social Determinants of Health   Financial Resource Strain: Not on file  Food Insecurity: Not on file  Transportation Needs:  Not on file  Physical Activity: Not on file  Stress: Not on file  Social Connections: Not on file   Family History  Problem Relation Age of Onset   Heart disease Mother        age 60   Hypertension Mother    Diabetes Mother    Heart disease Father        age 28   Hypertension Brother    Hypertension Maternal Grandmother    Diabetes Maternal Grandfather    Past Surgical History:  Procedure Laterality Date   BREAST CYST EXCISION Bilateral    CESAREAN SECTION  1971 &1977       Shannon Layman, MD 08/15/23 1002

## 2023-10-01 DIAGNOSIS — M67871 Other specified disorders of synovium, right ankle and foot: Secondary | ICD-10-CM | POA: Diagnosis not present

## 2023-10-16 DIAGNOSIS — E1169 Type 2 diabetes mellitus with other specified complication: Secondary | ICD-10-CM | POA: Diagnosis not present

## 2023-10-16 DIAGNOSIS — I129 Hypertensive chronic kidney disease with stage 1 through stage 4 chronic kidney disease, or unspecified chronic kidney disease: Secondary | ICD-10-CM | POA: Diagnosis not present

## 2023-10-16 DIAGNOSIS — E785 Hyperlipidemia, unspecified: Secondary | ICD-10-CM | POA: Diagnosis not present

## 2023-10-16 DIAGNOSIS — E559 Vitamin D deficiency, unspecified: Secondary | ICD-10-CM | POA: Diagnosis not present

## 2023-10-17 DIAGNOSIS — N183 Chronic kidney disease, stage 3 unspecified: Secondary | ICD-10-CM | POA: Diagnosis not present

## 2023-10-17 DIAGNOSIS — E1169 Type 2 diabetes mellitus with other specified complication: Secondary | ICD-10-CM | POA: Diagnosis not present

## 2023-10-17 DIAGNOSIS — E785 Hyperlipidemia, unspecified: Secondary | ICD-10-CM | POA: Diagnosis not present

## 2023-10-17 DIAGNOSIS — I129 Hypertensive chronic kidney disease with stage 1 through stage 4 chronic kidney disease, or unspecified chronic kidney disease: Secondary | ICD-10-CM | POA: Diagnosis not present

## 2023-10-17 DIAGNOSIS — E1122 Type 2 diabetes mellitus with diabetic chronic kidney disease: Secondary | ICD-10-CM | POA: Diagnosis not present

## 2023-10-17 DIAGNOSIS — E1165 Type 2 diabetes mellitus with hyperglycemia: Secondary | ICD-10-CM | POA: Diagnosis not present

## 2023-10-17 DIAGNOSIS — M1990 Unspecified osteoarthritis, unspecified site: Secondary | ICD-10-CM | POA: Diagnosis not present

## 2023-10-17 DIAGNOSIS — E559 Vitamin D deficiency, unspecified: Secondary | ICD-10-CM | POA: Diagnosis not present

## 2023-10-23 DIAGNOSIS — M79671 Pain in right foot: Secondary | ICD-10-CM | POA: Diagnosis not present

## 2023-10-23 DIAGNOSIS — M67871 Other specified disorders of synovium, right ankle and foot: Secondary | ICD-10-CM | POA: Diagnosis not present

## 2023-10-29 ENCOUNTER — Ambulatory Visit
Admission: EM | Admit: 2023-10-29 | Discharge: 2023-10-29 | Disposition: A | Discharge status: Home / Self Care | Payer: Medicare Other | Attending: Family Medicine | Admitting: Family Medicine

## 2023-10-29 DIAGNOSIS — B349 Viral infection, unspecified: Secondary | ICD-10-CM | POA: Diagnosis not present

## 2023-10-29 DIAGNOSIS — R635 Abnormal weight gain: Secondary | ICD-10-CM | POA: Insufficient documentation

## 2023-10-29 DIAGNOSIS — Z1211 Encounter for screening for malignant neoplasm of colon: Secondary | ICD-10-CM | POA: Insufficient documentation

## 2023-10-29 HISTORY — DX: Type 2 diabetes mellitus without complications: E11.9

## 2023-10-29 MED ORDER — ALBUTEROL SULFATE HFA 108 (90 BASE) MCG/ACT IN AERS: 1.0000 | INHALATION_SPRAY | Freq: Four times a day (QID) | RESPIRATORY_TRACT | 0 refills | Status: AC | PRN

## 2023-10-29 MED ORDER — PROMETHAZINE-DM 6.25-15 MG/5ML PO SYRP
5.0000 mL | ORAL_SOLUTION | Freq: Three times a day (TID) | ORAL | 0 refills | Status: AC | PRN
Start: 2023-10-29 — End: ?

## 2023-10-29 NOTE — ED Triage Notes (Signed)
 Pt presents with cough/congestion x 4 days. Cough productive for yellow phlegm. Torso/back pain with coughing. States her husband had it, was seen here and neg for covid/flu. Does not want testing. Had HA yesterday. No fever, other s/s.

## 2023-10-29 NOTE — ED Provider Notes (Signed)
 [name]    CSN: [id number] Arrival date & time: 10/29/23  1050      History   Chief Complaint Chief Complaint  Patient presents with   Cough    HPI Shannon [name] is a 68 y.o. female  presents for evaluation of URI symptoms for 4 days. Patient reports associated symptoms of cough, congestion, back pain with coughing only. Denies N/V/D, fevers, sore throat, ear pain, body aches, shortness of breath. Patient does not have a hx of asthma but states she typically needs an inhaler when she is sick. Patient is not an active smoker.   Reports sick contacts via husband.  States he was seen and tested negative for flu and COVID.  Pt has taken some OTC for symptoms. Pt has no other concerns at this time.    Cough   Past Medical History:  Diagnosis Date   Allergy    Arrhythmia    atrial fibrillation   Diabetes (HCC)    Hypertension    PAC (premature atrial contraction)    PVC (premature ventricular contraction)     Patient Active Problem List   Diagnosis Date Noted   Abnormal weight gain 10/29/2023   Colon cancer screening 10/29/2023   Morbid obesity (HCC) 10/29/2023   Osteoarthritis of carpometacarpal (CMC) joint of thumb 08/12/2021   Diabetes (HCC) 12/04/2019   H/O cesarean section 12/04/2019   High blood pressure 12/04/2019   PAF (paroxysmal atrial fibrillation) (HCC) 01/27/2011   Dyspnea 01/27/2011    Past Surgical History:  Procedure Laterality Date   BREAST CYST EXCISION Bilateral    CESAREAN SECTION  1971 &1977    OB History   No obstetric history on file.      Home Medications    Prior to Admission medications   Medication Sig Start Date End Date Taking? Authorizing Provider  albuterol (VENTOLIN HFA) 108 (90 Base) MCG/ACT inhaler Inhale 1-2 puffs into the lungs every 6 (six) hours as needed for wheezing or shortness of breath. 10/29/23  Yes Radford Pax, NP  amLODipine (NORVASC) 10 MG tablet Take 10 mg by mouth daily. 12/04/19  Yes  [provider]  amLODipine (NORVASC) 5 MG tablet Take 5 mg by mouth daily.   Yes [provider]  cetirizine (ZYRTEC) 10 MG tablet Take 10 mg by mouth daily.   Yes [provider]  ciprofloxacin (CIPRO) 500 MG tablet Take 500 mg by mouth 2 (two) times daily. 07/25/23  Yes [provider]  fluticasone (FLONASE) 50 MCG/ACT nasal spray Place 2 sprays into both nostrils daily. 12/16/21  Yes Leath-Warren, Sadie Haber, NP  glipiZIDE (GLUCOTROL XL) 2.5 MG 24 hr tablet Take by mouth daily with breakfast. 12/04/19  Yes [provider]  OZEMPIC, 0.25 OR 0.5 MG/DOSE, 2 MG/3ML SOPN  06/30/23  Yes [provider]  promethazine-dextromethorphan (PROMETHAZINE-DM) 6.25-15 MG/5ML syrup Take 5 mLs by mouth 3 (three) times daily as needed for cough. 10/29/23  Yes Radford Pax, NP  amoxicillin-clavulanate (AUGMENTIN) 875-125 MG tablet Take 1 tablet by mouth every 12 (twelve) hours. 12/16/21   Leath-Warren, Sadie Haber, NP  aspirin 325 MG tablet Take 325 mg by mouth daily.      [provider]  benazepril (LOTENSIN) 10 MG tablet Take 10 mg by mouth daily.    [provider]  doxycycline (VIBRAMYCIN) 100 MG capsule Take 1 capsule (100 mg total) by mouth 2 (two) times daily. 09/04/19   Cathie Hoops, Amy V, PA-C  fish oil-omega-3 fatty acids  1000 MG capsule Take 2 g by mouth daily.      [provider]  glimepiride (AMARYL) 2 MG tablet Take 2 mg by mouth daily with breakfast.    [provider]  predniSONE (STERAPRED UNI-PAK 21 TAB) 10 MG (21) TBPK tablet Take by mouth daily. Take as directed. 08/14/23   [name], MD  terbinafine (LAMISIL) 250 MG tablet Please take one a day x 7days, repeat every 4 weeks x 4 months 11/16/21   Lenn Sink, DPM    Family History Family History  Problem Relation Age of Onset   Heart disease Mother        age 103   Hypertension Mother    Diabetes Mother    Heart disease Father        age 82    Hypertension Brother    Hypertension Maternal Grandmother    Diabetes Maternal Grandfather     Social History Social History   Tobacco Use   Smoking status: Former    Current packs/day: 0.00    Types: Cigarettes    Quit date: 09/04/1988    Years since quitting: 35.1   Smokeless tobacco: Never  Substance Use Topics   Alcohol use: No   Drug use: No     Allergies   Codeine   Review of Systems Review of Systems  HENT:  Positive for congestion.   Respiratory:  Positive for cough.      Physical Exam Triage Vital Signs ED Triage Vitals  Encounter Vitals Group     BP 10/29/23 1410 (!) 158/94     Systolic BP Percentile --      Diastolic BP Percentile --      Pulse Rate 10/29/23 1410 88     Resp 10/29/23 1410 20     Temp 10/29/23 1410 98.3 F (36.8 C)     Temp Source 10/29/23 1410 Oral     SpO2 10/29/23 1410 94 %     Weight --      Height --      Head Circumference --      Peak Flow --      Pain Score 10/29/23 1406 0     Pain Loc --      Pain Education --      Exclude from Growth Chart --    No data found.  Updated Vital Signs BP (!) 158/94 (BP Location: Left Arm)   Pulse 88   Temp 98.3 F (36.8 C) (Oral)   Resp 20   SpO2 94%   Visual Acuity Right Eye Distance:   Left Eye Distance:   Bilateral Distance:    Right Eye Near:   Left Eye Near:    Bilateral Near:     Physical Exam Vitals and nursing note reviewed.  Constitutional:      General: She is not in acute distress.    Appearance: She is well-developed. She is not ill-appearing.  HENT:     Head: Normocephalic and atraumatic.     Right Ear: Tympanic membrane and ear canal normal.     Left Ear: Tympanic membrane and ear canal normal.     Nose: Congestion present.     Mouth/Throat:     Mouth: Mucous membranes are moist.     Pharynx: Oropharynx is clear. Uvula midline. No posterior oropharyngeal erythema.     Tonsils: No tonsillar exudate or tonsillar abscesses.  Eyes:     Conjunctiva/sclera:  Conjunctivae normal.     Pupils: Pupils  are equal, round, and reactive to light.  Cardiovascular:     Rate and Rhythm: Normal rate and regular rhythm.     Heart sounds: Normal heart sounds.  Pulmonary:     Effort: Pulmonary effort is normal.     Breath sounds: Normal breath sounds. No wheezing or rhonchi.  Musculoskeletal:     Cervical back: Normal range of motion and neck supple.  Lymphadenopathy:     Cervical: No cervical adenopathy.  Skin:    General: Skin is warm and dry.  Neurological:     General: No focal deficit present.     Mental Status: She is alert and oriented to person, place, and time.  Psychiatric:        Mood and Affect: Mood normal.        Behavior: Behavior normal.      UC Treatments / Results  Labs (all labs ordered are listed, but only abnormal results are displayed) Labs Reviewed - No data to display  EKG   Radiology No results found.  Procedures Procedures (including critical care time)  Medications Ordered in UC Medications - No data to display  Initial Impression / Assessment and Plan / UC Course  I have reviewed the triage vital signs and the nursing notes.  Pertinent labs & imaging results that were available during my care of the patient were reviewed by me and considered in my medical decision making (see chart for details).     Reviewed exam and symptoms with patient.  No red flags.  Discussed viral illness and symptomatic treatment.  Promethazine DM as needed for cough, side effect profile reviewed.  Albuterol inhaler as needed.  Advised PCP follow-up 2 to 3 days for recheck.  ER precautions reviewed and patient verbalized understanding. Final Clinical Impressions(s) / UC Diagnoses   Final diagnoses:  Viral illness     Discharge Instructions      You may take Promethazine DM as needed for your cough.  Please note this medication can make you drowsy.  Do not drink alcohol or drive while on this medication.  Albuterol inhaler as  needed for wheezing or shortness of breath.  Lots of fluids and rest.  Please follow-up with your PCP in 2 to 3 days for recheck.  Please go to the ER if you develop any worsening symptoms.  Hope you feel better soon!    ED Prescriptions     Medication Sig Dispense Auth. Provider   promethazine-dextromethorphan (PROMETHAZINE-DM) 6.25-15 MG/5ML syrup Take 5 mLs by mouth 3 (three) times daily as needed for cough. 118 mL Radford Pax, NP   albuterol (VENTOLIN HFA) 108 (90 Base) MCG/ACT inhaler Inhale 1-2 puffs into the lungs every 6 (six) hours as needed for wheezing or shortness of breath. 1 each Radford Pax, NP      PDMP not reviewed this encounter.   Radford Pax, NP 10/29/23 1435

## 2023-10-29 NOTE — Discharge Instructions (Signed)
 You may take Promethazine DM as needed for your cough.  Please note this medication can make you drowsy.  Do not drink alcohol or drive while on this medication.  Albuterol inhaler as needed for wheezing or shortness of breath.  Lots of fluids and rest.  Please follow-up with your PCP in 2 to 3 days for recheck.  Please go to the ER if you develop any worsening symptoms.  Hope you feel better soon!

## 2023-10-30 DIAGNOSIS — M79671 Pain in right foot: Secondary | ICD-10-CM | POA: Diagnosis not present

## 2023-10-30 DIAGNOSIS — M67871 Other specified disorders of synovium, right ankle and foot: Secondary | ICD-10-CM | POA: Diagnosis not present

## 2023-11-01 DIAGNOSIS — M79671 Pain in right foot: Secondary | ICD-10-CM | POA: Diagnosis not present

## 2023-11-01 DIAGNOSIS — M67871 Other specified disorders of synovium, right ankle and foot: Secondary | ICD-10-CM | POA: Diagnosis not present

## 2023-11-12 DIAGNOSIS — M67871 Other specified disorders of synovium, right ankle and foot: Secondary | ICD-10-CM | POA: Diagnosis not present

## 2023-11-13 DIAGNOSIS — M79671 Pain in right foot: Secondary | ICD-10-CM | POA: Diagnosis not present

## 2023-11-13 DIAGNOSIS — M67871 Other specified disorders of synovium, right ankle and foot: Secondary | ICD-10-CM | POA: Diagnosis not present

## 2024-02-02 DIAGNOSIS — E1169 Type 2 diabetes mellitus with other specified complication: Secondary | ICD-10-CM | POA: Diagnosis not present

## 2024-02-02 DIAGNOSIS — N183 Chronic kidney disease, stage 3 unspecified: Secondary | ICD-10-CM | POA: Diagnosis not present

## 2024-02-02 DIAGNOSIS — M1990 Unspecified osteoarthritis, unspecified site: Secondary | ICD-10-CM | POA: Diagnosis not present

## 2024-03-03 DIAGNOSIS — M1990 Unspecified osteoarthritis, unspecified site: Secondary | ICD-10-CM | POA: Diagnosis not present

## 2024-03-03 DIAGNOSIS — E1169 Type 2 diabetes mellitus with other specified complication: Secondary | ICD-10-CM | POA: Diagnosis not present

## 2024-03-03 DIAGNOSIS — N183 Chronic kidney disease, stage 3 unspecified: Secondary | ICD-10-CM | POA: Diagnosis not present

## 2024-03-24 DIAGNOSIS — L309 Dermatitis, unspecified: Secondary | ICD-10-CM | POA: Diagnosis not present

## 2024-04-03 DIAGNOSIS — M1990 Unspecified osteoarthritis, unspecified site: Secondary | ICD-10-CM | POA: Diagnosis not present

## 2024-04-03 DIAGNOSIS — E1169 Type 2 diabetes mellitus with other specified complication: Secondary | ICD-10-CM | POA: Diagnosis not present

## 2024-04-03 DIAGNOSIS — N183 Chronic kidney disease, stage 3 unspecified: Secondary | ICD-10-CM | POA: Diagnosis not present

## 2024-04-08 DIAGNOSIS — E1169 Type 2 diabetes mellitus with other specified complication: Secondary | ICD-10-CM | POA: Diagnosis not present

## 2024-04-08 DIAGNOSIS — I129 Hypertensive chronic kidney disease with stage 1 through stage 4 chronic kidney disease, or unspecified chronic kidney disease: Secondary | ICD-10-CM | POA: Diagnosis not present

## 2024-04-08 DIAGNOSIS — Z0000 Encounter for general adult medical examination without abnormal findings: Secondary | ICD-10-CM | POA: Diagnosis not present

## 2024-04-08 DIAGNOSIS — E785 Hyperlipidemia, unspecified: Secondary | ICD-10-CM | POA: Diagnosis not present

## 2024-04-08 DIAGNOSIS — Z0189 Encounter for other specified special examinations: Secondary | ICD-10-CM | POA: Diagnosis not present

## 2024-04-08 DIAGNOSIS — Z23 Encounter for immunization: Secondary | ICD-10-CM | POA: Diagnosis not present

## 2024-04-08 DIAGNOSIS — E1122 Type 2 diabetes mellitus with diabetic chronic kidney disease: Secondary | ICD-10-CM | POA: Diagnosis not present

## 2024-04-08 DIAGNOSIS — Z1239 Encounter for other screening for malignant neoplasm of breast: Secondary | ICD-10-CM | POA: Diagnosis not present

## 2024-04-08 DIAGNOSIS — E559 Vitamin D deficiency, unspecified: Secondary | ICD-10-CM | POA: Diagnosis not present

## 2024-04-08 DIAGNOSIS — R059 Cough, unspecified: Secondary | ICD-10-CM | POA: Diagnosis not present

## 2024-04-08 DIAGNOSIS — L309 Dermatitis, unspecified: Secondary | ICD-10-CM | POA: Diagnosis not present

## 2024-04-09 ENCOUNTER — Other Ambulatory Visit: Payer: Self-pay | Admitting: Family Medicine

## 2024-04-09 DIAGNOSIS — Z1231 Encounter for screening mammogram for malignant neoplasm of breast: Secondary | ICD-10-CM

## 2024-05-04 DIAGNOSIS — N183 Chronic kidney disease, stage 3 unspecified: Secondary | ICD-10-CM | POA: Diagnosis not present

## 2024-05-04 DIAGNOSIS — M1990 Unspecified osteoarthritis, unspecified site: Secondary | ICD-10-CM | POA: Diagnosis not present

## 2024-05-04 DIAGNOSIS — E1169 Type 2 diabetes mellitus with other specified complication: Secondary | ICD-10-CM | POA: Diagnosis not present

## 2024-05-09 ENCOUNTER — Ambulatory Visit
Admission: RE | Admit: 2024-05-09 | Discharge: 2024-05-09 | Disposition: A | Discharge status: Home / Self Care | Source: Ambulatory Visit | Attending: Family Medicine | Admitting: Family Medicine

## 2024-05-09 DIAGNOSIS — Z1231 Encounter for screening mammogram for malignant neoplasm of breast: Secondary | ICD-10-CM | POA: Diagnosis not present

## 2024-06-03 DIAGNOSIS — M1990 Unspecified osteoarthritis, unspecified site: Secondary | ICD-10-CM | POA: Diagnosis not present

## 2024-06-03 DIAGNOSIS — E1169 Type 2 diabetes mellitus with other specified complication: Secondary | ICD-10-CM | POA: Diagnosis not present

## 2024-06-03 DIAGNOSIS — N183 Chronic kidney disease, stage 3 unspecified: Secondary | ICD-10-CM | POA: Diagnosis not present
